# Patient Record
Sex: Female | Born: 1935 | Race: White | Hispanic: No | Smoking: Former smoker
Health system: Southern US, Community
[De-identification: ages and names within clinical notes are randomized; demographics above are authoritative.]

## PROBLEM LIST (undated history)

## (undated) DIAGNOSIS — G8929 Other chronic pain: Secondary | ICD-10-CM

## (undated) DIAGNOSIS — E876 Hypokalemia: Secondary | ICD-10-CM

## (undated) DIAGNOSIS — R413 Other amnesia: Secondary | ICD-10-CM

## (undated) DIAGNOSIS — F32A Depression, unspecified: Secondary | ICD-10-CM

## (undated) DIAGNOSIS — F308 Other manic episodes: Secondary | ICD-10-CM

## (undated) DIAGNOSIS — F419 Anxiety disorder, unspecified: Secondary | ICD-10-CM

## (undated) DIAGNOSIS — I10 Essential (primary) hypertension: Secondary | ICD-10-CM

## (undated) DIAGNOSIS — I952 Hypotension due to drugs: Secondary | ICD-10-CM

## (undated) DIAGNOSIS — M810 Age-related osteoporosis without current pathological fracture: Secondary | ICD-10-CM

## (undated) DIAGNOSIS — D649 Anemia, unspecified: Secondary | ICD-10-CM

## (undated) DIAGNOSIS — E46 Unspecified protein-calorie malnutrition: Secondary | ICD-10-CM

## (undated) DIAGNOSIS — R682 Dry mouth, unspecified: Secondary | ICD-10-CM

## (undated) DIAGNOSIS — I509 Heart failure, unspecified: Secondary | ICD-10-CM

## (undated) DIAGNOSIS — F329 Major depressive disorder, single episode, unspecified: Secondary | ICD-10-CM

## (undated) DIAGNOSIS — N3090 Cystitis, unspecified without hematuria: Secondary | ICD-10-CM

## (undated) DIAGNOSIS — N183 Chronic kidney disease, stage 3 unspecified: Secondary | ICD-10-CM

## (undated) DIAGNOSIS — I5023 Acute on chronic systolic (congestive) heart failure: Secondary | ICD-10-CM

## (undated) DIAGNOSIS — J309 Allergic rhinitis, unspecified: Secondary | ICD-10-CM

## (undated) DIAGNOSIS — R269 Unspecified abnormalities of gait and mobility: Secondary | ICD-10-CM

## (undated) DIAGNOSIS — E039 Hypothyroidism, unspecified: Secondary | ICD-10-CM

## (undated) HISTORY — DX: Chronic kidney disease, stage 3 (moderate): N18.3

## (undated) HISTORY — DX: Unspecified protein-calorie malnutrition: E46

## (undated) HISTORY — DX: Anemia, unspecified: D64.9

## (undated) HISTORY — DX: Heart failure, unspecified: I50.9

## (undated) HISTORY — DX: Major depressive disorder, single episode, unspecified: F32.9

## (undated) HISTORY — DX: Hypotension due to drugs: I95.2

## (undated) HISTORY — DX: Depression, unspecified: F32.A

## (undated) HISTORY — DX: Hypothyroidism, unspecified: E03.9

## (undated) HISTORY — DX: Anxiety disorder, unspecified: F41.9

## (undated) HISTORY — DX: Allergic rhinitis, unspecified: J30.9

## (undated) HISTORY — DX: Other amnesia: R41.3

## (undated) HISTORY — DX: Essential (primary) hypertension: I10

## (undated) HISTORY — DX: Dry mouth, unspecified: R68.2

## (undated) HISTORY — DX: Other manic episodes: F30.8

## (undated) HISTORY — DX: Other chronic pain: G89.29

## (undated) HISTORY — DX: Cystitis, unspecified without hematuria: N30.90

## (undated) HISTORY — PX: CATARACT EXTRACTION: SUR2

## (undated) HISTORY — DX: Acute on chronic systolic (congestive) heart failure: I50.23

## (undated) HISTORY — DX: Age-related osteoporosis without current pathological fracture: M81.0

## (undated) HISTORY — DX: Chronic kidney disease, stage 3 unspecified: N18.30

## (undated) HISTORY — PX: ABDOMINAL HYSTERECTOMY: SHX81

## (undated) HISTORY — DX: Unspecified abnormalities of gait and mobility: R26.9

---

## 2012-11-17 ENCOUNTER — Non-Acute Institutional Stay (SKILLED_NURSING_FACILITY): Payer: Medicare Other | Admitting: Adult Health

## 2012-11-17 DIAGNOSIS — K5900 Constipation, unspecified: Secondary | ICD-10-CM

## 2012-11-17 DIAGNOSIS — D649 Anemia, unspecified: Secondary | ICD-10-CM

## 2012-11-17 DIAGNOSIS — E039 Hypothyroidism, unspecified: Secondary | ICD-10-CM

## 2012-11-17 DIAGNOSIS — F411 Generalized anxiety disorder: Secondary | ICD-10-CM

## 2012-11-17 DIAGNOSIS — J309 Allergic rhinitis, unspecified: Secondary | ICD-10-CM

## 2012-11-17 DIAGNOSIS — F419 Anxiety disorder, unspecified: Secondary | ICD-10-CM

## 2012-11-17 DIAGNOSIS — I509 Heart failure, unspecified: Secondary | ICD-10-CM

## 2012-11-17 DIAGNOSIS — F329 Major depressive disorder, single episode, unspecified: Secondary | ICD-10-CM

## 2012-11-19 ENCOUNTER — Non-Acute Institutional Stay (SKILLED_NURSING_FACILITY): Payer: Medicare Other | Admitting: Internal Medicine

## 2012-11-19 DIAGNOSIS — E039 Hypothyroidism, unspecified: Secondary | ICD-10-CM

## 2012-11-19 DIAGNOSIS — I509 Heart failure, unspecified: Secondary | ICD-10-CM

## 2012-11-19 DIAGNOSIS — J309 Allergic rhinitis, unspecified: Secondary | ICD-10-CM

## 2012-11-19 DIAGNOSIS — F411 Generalized anxiety disorder: Secondary | ICD-10-CM

## 2012-11-20 ENCOUNTER — Other Ambulatory Visit: Payer: Self-pay | Admitting: *Deleted

## 2012-11-20 MED ORDER — ALPRAZOLAM 1 MG PO TABS: ORAL_TABLET | ORAL | Status: DC

## 2012-11-23 ENCOUNTER — Other Ambulatory Visit: Payer: Self-pay | Admitting: *Deleted

## 2012-11-23 MED ORDER — ALPRAZOLAM 1 MG PO TABS: ORAL_TABLET | ORAL | Status: DC

## 2012-11-24 ENCOUNTER — Non-Acute Institutional Stay (SKILLED_NURSING_FACILITY): Payer: Medicare Other | Admitting: Adult Health

## 2012-11-24 DIAGNOSIS — F419 Anxiety disorder, unspecified: Secondary | ICD-10-CM

## 2012-11-24 DIAGNOSIS — F411 Generalized anxiety disorder: Secondary | ICD-10-CM

## 2012-12-15 DIAGNOSIS — E039 Hypothyroidism, unspecified: Secondary | ICD-10-CM | POA: Insufficient documentation

## 2012-12-15 DIAGNOSIS — F411 Generalized anxiety disorder: Secondary | ICD-10-CM | POA: Insufficient documentation

## 2012-12-15 DIAGNOSIS — I5022 Chronic systolic (congestive) heart failure: Secondary | ICD-10-CM | POA: Insufficient documentation

## 2012-12-15 DIAGNOSIS — J309 Allergic rhinitis, unspecified: Secondary | ICD-10-CM | POA: Insufficient documentation

## 2012-12-15 NOTE — Progress Notes (Signed)
Patient ID: Isabel Chaney, female   DOB: 07-31-1919, 79 y.o.   MRN: [id number]        HISTORY & PHYSICAL  DATE: 11/19/2012   FACILITY: [facility]  LEVEL OF CARE: SNF (31)  ALLERGIES:   Penicillin.    CHIEF COMPLAINT:  Manage hypothyroidism, CHF, and allergic rhinitis.    HISTORY OF PRESENT ILLNESS:  The patient is a 79 year-old, Caucasian female who was  transferred from [facility].  She has the following problems:    HYPOTHYROIDISM: The hypothyroidism remains stable. No complications noted from the medications presently being used.  The patient denies fatigue or constipation.  Last TSH:  Not available.    CHF:The patient does not relate significant weight changes, denies sob, DOE, orthopnea, PNDs, pedal edema, palpitations or chest pain.  CHF remains stable.  No complications form the medications being used.    ALLERGIC RHINITIS: Allergic rhinitis remains stable. Patient denies ongoing symptoms such as runny nose, sneezing or tearing. No complications reported from the current medication(s) being used.    PAST MEDICAL HISTORY :   Hypothyroidism.    CHF.    Allergic rhinitis.    Chronic anemia.    Anxiety.    Depression.    Constipation.    PAST SURGICAL HISTORY: None  SOCIAL HISTORY: TOBACCO USE:  The patient has a history of smoking, but quit in 1966.     ALCOHOL:  Denies alcohol use.   ILLICIT DRUGS:  Denies illicit drug use.    EMPLOYMENT HISTORY:  She is a retired Copywriter, advertising.    FAMILY HISTORY:  Significant for:    Coronary artery disease.   CVA.    CURRENT MEDICATIONS: Reviewed per [facility]  REVIEW OF SYSTEMS:  See HPI otherwise 14 point ROS is negative.  PHYSICAL EXAMINATION  VS:  T 98.1       P 51      RR 16      BP 124/70      POX 96%        WT (Lb) 141.3    GENERAL: no acute distress, moderately obese body habitus EYES: conjunctivae normal, sclerae normal, normal eye lids MOUTH/THROAT: lips without  lesions,no lesions in the mouth,tongue is without lesions,uvula elevates in midline NECK: supple, trachea midline, no neck masses, no thyroid tenderness, no thyromegaly LYMPHATICS: no LAN in the neck, no supraclavicular LAN RESPIRATORY: breathing is even & unlabored, BS CTAB CARDIAC: RRR, no murmur,no extra heart sounds, no edema GI:  ABDOMEN: abdomen soft, normal BS, no masses, no tenderness  LIVER/SPLEEN: no hepatomegaly, no splenomegaly MUSCULOSKELETAL: HEAD: normal to inspection & palpation BACK: no kyphosis, scoliosis or spinal processes tenderness EXTREMITIES: LEFT UPPER EXTREMITY: strength decreased, range of motion minimal   RIGHT UPPER EXTREMITY: strength decreased, range of motion minimal   LEFT LOWER EXTREMITY: strength unable to assess, range of motion minimal  RIGHT LOWER EXTREMITY: strength unable to assess, range of motion minimal   PSYCHIATRIC: the patient is alert & oriented to person, affect & behavior appropriate  LABS/RADIOLOGY:  None received at admission.      ASSESSMENT/PLAN:  Hypothyroidism.  Continue levothyroxine.    CHF.  Well compensated.    Allergic rhinitis.  Denies ongoing symptoms.     Anxiety.  Stable.  We will decrease Xanax to 1 mg b.i.d. on daughter's request.    Chronic kidney disease stage III.  We will assess renal functions.    Anemia of chronic disease.  Check  hemoglobin level.    Check CBC and BMP.    I have reviewed patient's medical records received at admission/from hospitalization.  CPT CODE: 16109

## 2012-12-28 ENCOUNTER — Other Ambulatory Visit: Payer: Self-pay | Admitting: *Deleted

## 2012-12-28 MED ORDER — FENTANYL 12 MCG/HR TD PT72: 1.0000 | MEDICATED_PATCH | TRANSDERMAL | Status: DC

## 2012-12-28 NOTE — Telephone Encounter (Signed)
rx printed, signed, faxed 

## 2013-01-01 ENCOUNTER — Non-Acute Institutional Stay (SKILLED_NURSING_FACILITY): Payer: Medicare Other | Admitting: Adult Health

## 2013-01-01 DIAGNOSIS — N390 Urinary tract infection, site not specified: Secondary | ICD-10-CM

## 2013-01-04 ENCOUNTER — Non-Acute Institutional Stay (SKILLED_NURSING_FACILITY): Payer: Medicare Other | Admitting: Adult Health

## 2013-01-04 DIAGNOSIS — F419 Anxiety disorder, unspecified: Secondary | ICD-10-CM

## 2013-01-04 DIAGNOSIS — D649 Anemia, unspecified: Secondary | ICD-10-CM | POA: Insufficient documentation

## 2013-01-04 DIAGNOSIS — F329 Major depressive disorder, single episode, unspecified: Secondary | ICD-10-CM | POA: Insufficient documentation

## 2013-01-04 DIAGNOSIS — H6120 Impacted cerumen, unspecified ear: Secondary | ICD-10-CM | POA: Insufficient documentation

## 2013-01-04 DIAGNOSIS — N390 Urinary tract infection, site not specified: Secondary | ICD-10-CM | POA: Insufficient documentation

## 2013-01-04 DIAGNOSIS — F32A Depression, unspecified: Secondary | ICD-10-CM | POA: Insufficient documentation

## 2013-01-04 DIAGNOSIS — K5900 Constipation, unspecified: Secondary | ICD-10-CM | POA: Insufficient documentation

## 2013-01-04 DIAGNOSIS — E039 Hypothyroidism, unspecified: Secondary | ICD-10-CM | POA: Insufficient documentation

## 2013-01-04 DIAGNOSIS — I509 Heart failure, unspecified: Secondary | ICD-10-CM

## 2013-01-04 DIAGNOSIS — H6123 Impacted cerumen, bilateral: Secondary | ICD-10-CM

## 2013-01-04 DIAGNOSIS — J309 Allergic rhinitis, unspecified: Secondary | ICD-10-CM

## 2013-01-04 DIAGNOSIS — F411 Generalized anxiety disorder: Secondary | ICD-10-CM

## 2013-01-04 NOTE — Progress Notes (Signed)
Patient ID: Isabel Chaney, female   DOB: [birth year], 79 y.o.   MRN: [id number]       PROGRESS NOTE  DATE: 11/17/2012  FACILITY:  [facility]  LEVEL OF CARE:   SNF (31)  Acute Visit  CHIEF COMPLAINT:  Admission Notes  HISTORY OF PRESENT ILLNESS: This is a 79 year old female who has been admitted to [facility] on 11/16/12 from [facility]. She has been admitted for  long-term care.  REASSESSMENT OF ONGOING PROBLEM(S):  CHF:The patient does not relate significant weight changes, denies sob, DOE, orthopnea, PNDs, pedal edema, palpitations or chest pain.  CHF remains stable.  No complications form the medications being used.  DEPRESSION: The depression remains stable. Patient denies ongoing feelings of sadness, insomnia, anedhonia or lack of appetite. No complications reported from the medications currently being used. Staff do not report behavioral problems.  CONSTIPATION: The constipation remains stable. No complications from the medications presently being used. Patient denies ongoing constipation, abdominal pain, nausea or vomiting.  PAST MEDICAL HISTORY : Reviewed.  No changes.  CURRENT MEDICATIONS: Reviewed per [facility]  REVIEW OF SYSTEMS:  GENERAL: no change in appetite, no fatigue, no weight changes, no fever, chills or weakness RESPIRATORY: no cough, SOB, DOE, wheezing, hemoptysis CARDIAC: no chest pain, edema or palpitations GI: no abdominal pain, diarrhea, constipation, heart burn, nausea or vomiting MUSCULOSKELETAL:  + low back pain   PHYSICAL EXAMINATION  VS:  T96       P78      RR18      BP120/74          GENERAL: no acute distress, normal body habitus EYES: conjunctivae normal, sclerae normal, normal eye lids NECK: supple, trachea midline, no neck masses, no thyroid tenderness, no thyromegaly LYMPHATICS: no LAN in the neck, no supraclavicular LAN RESPIRATORY: breathing is even & unlabored, BS CTAB CARDIAC: RRR, no murmur,no extra heart sounds, no  edema GI: abdomen soft, normal BS, no masses, no tenderness, no hepatomegaly, no splenomegaly PSYCHIATRIC: the patient is alert & oriented to person, affect & behavior appropriate  LABS/RADIOLOGY: None in chart    ASSESSMENT/PLAN:  CHF - well-compensated  Hypothyroidism - continue Synthroid  Anemia - continue Ferrous Sulfate  Depression - stable  Allergic Rhinitis - stable  Constipation - no complaints  Anxiety - stable  Generalized Pain - start Percocet 5/325 mg 1 tab PO Q 4 hours PRN    CPT CODE: 56213

## 2013-01-04 NOTE — Progress Notes (Addendum)
Patient ID: Isabel Chaney, female   DOB: [birth year], 79 y.o.   MRN: [id number]       PROGRESS NOTE  DATE: 01/01/2013  FACILITY:    [facility]  LEVEL OF CARE:   SNF (31)  Acute Visit  CHIEF COMPLAINT:  Manage UTI  HISTORY OF PRESENT ILLNESS: This is a 79 year old female who has urine culture >= 100,000 colonies/ml Enterococcus faecalis. Urine culture was obtained due to dysuria. No reported hematuria nor fever.   PAST MEDICAL HISTORY : Reviewed.  No changes.  CURRENT MEDICATIONS: Reviewed per [facility]  REVIEW OF SYSTEMS:  GENERAL: no change in appetite, no fatigue, no weight changes, no fever, chills or weakness RESPIRATORY: no cough, SOB, DOE,, wheezing, hemoptysis CARDIAC: no chest pain, edema or palpitations GI: no abdominal pain, diarrhea, constipation, heart burn, nausea or vomiting GU:  +Dysuria   PHYSICAL EXAMINATION  VS:  T97        P70       RR20       BP135/58      POX %       WT 143.2(Lb)  GENERAL: no acute distress, normal body habitus EYES: conjunctivae normal, sclerae normal, normal eye lids NECK: supple, trachea midline, no neck masses, no thyroid tenderness, no thyromegaly RESPIRATORY: breathing is even & unlabored, BS CTAB CARDIAC: RRR, no murmur,no extra heart sounds, no edema GI: abdomen soft, normal BS, no masses, no tenderness, no hepatomegaly, no splenomegaly PSYCHIATRIC: the patient is alert & oriented to person, affect & behavior appropriate  LABS/RADIOLOGY:  ASSESSMENT/PLAN:  UTI - start Macrobid 100 mg 1 capsule PO BID x 10 days  CPT CODE: 40981

## 2013-01-04 NOTE — Progress Notes (Signed)
Patient ID: Isabel Chaney, female   DOB: 06-11-1919, 79 y.o.   MRN: [id number]       PROGRESS NOTE  DATE:  01/04/13  FACILITY: Nursing Home Location: [facility]  LEVEL OF CARE: SNF (31)   Routine Visit  CHIEF COMPLAINT:  Manage CHF, Hypothyroidism, Anemia and Depression  HISTORY OF PRESENT ILLNESS:   REASSESSMENT OF ONGOING PROBLEM(S):  HYPOTHYROIDISM: The hypothyroidism remains stable. No complications noted from the medications presently being used.  The patient denies fatigue or constipation.    CHF:The patient does not relate significant weight changes, denies sob, DOE, orthopnea, PNDs, pedal edema, palpitations or chest pain.  CHF remains stable.  No complications form the medications being used.  ANXIETY: The anxiety remains stable. Patient denies ongoing anxiety or irritability. No complications reported from the medications currently being used.  PAST MEDICAL HISTORY : Reviewed.  No changes.  CURRENT MEDICATIONS: Reviewed per [facility]  REVIEW OF SYSTEMS:  GENERAL: no change in appetite, no fatigue, no weight changes, no fever, chills or weakness EARS: decreased hearing on both ears RESPIRATORY: no cough, SOB, DOE, wheezing, hemoptysis CARDIAC: no chest pain, edema or palpitations GI: no abdominal pain, diarrhea, constipation, heart burn, nausea or vomiting MUSCULOSKELETAL:  + low back pain   PHYSICAL EXAMINATION  VS:  T97      P60      RR18      BP112/56      WT 143.2 LBS    GENERAL: no acute distress, normal body habitus EARS: noted to have moderate earwax on bilateral ears EYES: conjunctivae normal, sclerae normal, normal eye lids NECK: supple, trachea midline, no neck masses, no thyroid tenderness, no thyromegaly RESPIRATORY: breathing is even & unlabored, BS CTAB CARDIAC: RRR, no murmur,no extra heart sounds, no edema GI: abdomen soft, normal BS, no masses, no tenderness, no hepatomegaly, no splenomegaly PSYCHIATRIC: the patient is alert &  oriented to person, affect & behavior appropriate  LABS/RADIOLOGY: None in chart    ASSESSMENT/PLAN:  CHF - well-compensated  Hypothyroidism - continue Synthroid; check tsh  Anemia - continue Ferrous Sulfate; check CBC  Depression - stable; continue Zoloft  Allergic Rhinitis - stable; continue Allegra  Constipation - no complaints  Anxiety - stable; continue Xanax      CPT CODE: 53664

## 2013-01-04 NOTE — Progress Notes (Signed)
Patient ID: Isabel Chaney, female   DOB: 17-May-1919, 79 y.o.   MRN: [id number]       PROGRESS NOTE  DATE: 11/24/2012  FACILITY:  [facility]  LEVEL OF CARE: SNF (31)  Acute Visit  CHIEF COMPLAINT:  Manage Anxiety  HISTORY OF PRESENT ILLNESS: This is a 79 year old female who was noted to be anxious. She was noted to be fidgeting with her knitting materials - repeatedly taking in and out from her drawer. Xanax dosage was recently decreased. Daughter requested Xanax dosage to be increased to her previous dosage.  PAST MEDICAL HISTORY : Reviewed.  No changes.  CURRENT MEDICATIONS: Reviewed per [facility]  REVIEW OF SYSTEMS:  GENERAL: no change in appetite, no fatigue, no weight changes, no fever, chills or weakness RESPIRATORY: no cough, SOB, DOE,, wheezing, hemoptysis CARDIAC: no chest pain, edema or palpitations GI: no abdominal pain, diarrhea, constipation, heart burn, nausea or vomiting  PHYSICAL EXAMINATION  VS:  T97        P84       RR20       BP110/62      POX95 %       WT147.4 (Lb)  GENERAL: no acute distress, normal body habitus EYES: conjunctivae normal, sclerae normal, normal eye lids NECK: supple, trachea midline, no neck masses, no thyroid tenderness, no thyromegaly LYMPHATICS: no LAN in the neck, no supraclavicular LAN RESPIRATORY: breathing is even & unlabored, BS CTAB CARDIAC: RRR, no murmur,no extra heart sounds, no edema GI: abdomen soft, normal BS, no masses, no tenderness, no hepatomegaly, no splenomegaly PSYCHIATRIC: the patient is alert & oriented to person, anxious  LABS/RADIOLOGY: None in chart   ASSESSMENT/PLAN:  Anxiety -  Increase Xanax to 1 mg 1 tab PO TID  CPT CODE: 40981

## 2013-01-06 ENCOUNTER — Other Ambulatory Visit: Payer: Self-pay | Admitting: *Deleted

## 2013-01-06 MED ORDER — FENTANYL 12 MCG/HR TD PT72: MEDICATED_PATCH | TRANSDERMAL | Status: DC

## 2013-02-04 ENCOUNTER — Non-Acute Institutional Stay (SKILLED_NURSING_FACILITY): Payer: Medicare Other | Admitting: Adult Health

## 2013-02-04 DIAGNOSIS — K5900 Constipation, unspecified: Secondary | ICD-10-CM

## 2013-02-04 DIAGNOSIS — R682 Dry mouth, unspecified: Secondary | ICD-10-CM

## 2013-02-04 DIAGNOSIS — E039 Hypothyroidism, unspecified: Secondary | ICD-10-CM

## 2013-02-04 DIAGNOSIS — K117 Disturbances of salivary secretion: Secondary | ICD-10-CM

## 2013-02-04 DIAGNOSIS — J309 Allergic rhinitis, unspecified: Secondary | ICD-10-CM

## 2013-02-04 DIAGNOSIS — F329 Major depressive disorder, single episode, unspecified: Secondary | ICD-10-CM

## 2013-02-04 DIAGNOSIS — I509 Heart failure, unspecified: Secondary | ICD-10-CM

## 2013-02-08 ENCOUNTER — Other Ambulatory Visit: Payer: Self-pay

## 2013-02-08 MED ORDER — FENTANYL 12 MCG/HR TD PT72: MEDICATED_PATCH | TRANSDERMAL | Status: DC

## 2013-02-11 DIAGNOSIS — R682 Dry mouth, unspecified: Secondary | ICD-10-CM | POA: Insufficient documentation

## 2013-02-11 NOTE — Progress Notes (Signed)
Patient ID: Isabel Chaney, female   DOB: November 14, 1919, 79 y.o.   MRN: [id number]       PROGRESS NOTE  DATE:  02/04/13  FACILITY: Nursing Home Location: [facility]  LEVEL OF CARE: SNF (31)   Routine Visit  CHIEF COMPLAINT:  Manage CHF, Hypothyroidism and Depression  HISTORY OF PRESENT ILLNESS:   REASSESSMENT OF ONGOING PROBLEM(S):  DEPRESSION: The depression remains stable. Patient denies ongoing feelings of sadness, insomnia, anedhonia or lack of appetite. No complications reported from the medications currently being used. Staff do not report behavioral problems.  CONSTIPATION: The constipation remains stable. No complications from the medications presently being used. Patient denies ongoing constipation, abdominal pain, nausea or vomiting.  ALLERGIC RHINITIS: Allergic rhinitis remains stable.  Patient denies ongoing symptoms such as runny nose sneezing or tearing. No complications reported from the current medication(s) being used.  PAST MEDICAL HISTORY : Reviewed.  No changes.  CURRENT MEDICATIONS: Reviewed per [facility]  REVIEW OF SYSTEMS:  GENERAL: no change in appetite, no fatigue, no weight changes, no fever, chills or weakness MOUTH: + dry mouth EARS: decreased hearing on both ears RESPIRATORY: no cough, SOB, DOE, wheezing, hemoptysis CARDIAC: no chest pain, edema or palpitations GI: no abdominal pain, diarrhea, constipation, heart burn, nausea or vomiting MUSCULOSKELETAL:  + low back pain   PHYSICAL EXAMINATION  VS:  T97.4     P72      RR18     BP132/68      WT 152.8 LBS    GENERAL: no acute distress, normal body habitus MOUTH :  No lesions EARS: noted to have moderate earwax on bilateral ears NECK: supple, trachea midline, no neck masses, no thyroid tenderness, no thyromegaly RESPIRATORY: breathing is even & unlabored, BS CTAB CARDIAC: RRR, no murmur,no extra heart sounds, no edema GI: abdomen soft, normal BS, no masses, no tenderness, no hepatomegaly,  no splenomegaly PSYCHIATRIC: the patient is alert & oriented to person, affect & behavior appropriate  LABS/RADIOLOGY: 01/28/13 BNP 158 TSH 2.5230 01/05/13 WBC 4.2 hemoglobin 12.0 hematocrit 37.9 sodium 137 potassium 4.4 glucose 129 BUN 42 creatinine 1.0 calcium 10.1 TSH 2.0 910  ASSESSMENT/PLAN:  CHF - well-compensated; continue Demadex and Coreg  Hypothyroidism - continue Synthroid; check tsh  Depression - stable; continue Zoloft  Allergic Rhinitis - stable; continue Allegra  Constipation - no complaints  Anxiety - stable; continue Xanax  Dry mouth - start Biotin mouthwash 10 ml swish and spit Q HS    CPT CODE: 01027

## 2013-03-01 ENCOUNTER — Other Ambulatory Visit: Payer: Self-pay | Admitting: *Deleted

## 2013-03-01 MED ORDER — FENTANYL 12 MCG/HR TD PT72: MEDICATED_PATCH | TRANSDERMAL | Status: DC

## 2013-03-10 ENCOUNTER — Non-Acute Institutional Stay (SKILLED_NURSING_FACILITY): Payer: Medicare Other | Admitting: Adult Health

## 2013-03-10 DIAGNOSIS — E039 Hypothyroidism, unspecified: Secondary | ICD-10-CM

## 2013-03-10 DIAGNOSIS — J309 Allergic rhinitis, unspecified: Secondary | ICD-10-CM

## 2013-03-10 DIAGNOSIS — I509 Heart failure, unspecified: Secondary | ICD-10-CM

## 2013-03-10 DIAGNOSIS — K5900 Constipation, unspecified: Secondary | ICD-10-CM

## 2013-03-10 DIAGNOSIS — F411 Generalized anxiety disorder: Secondary | ICD-10-CM

## 2013-03-10 DIAGNOSIS — F329 Major depressive disorder, single episode, unspecified: Secondary | ICD-10-CM

## 2013-03-10 NOTE — Progress Notes (Signed)
Patient ID: Isabel Chaney, female   DOB: [birth year], 79 y.o.   MRN: [id number]                PROGRESS NOTE  DATE:  03/10/13  FACILITY: Nursing Home Location: [facility]  LEVEL OF CARE: SNF (31)   Routine Visit  CHIEF COMPLAINT:  Manage CHF, Hypothyroidism and Depression  HISTORY OF PRESENT ILLNESS:   REASSESSMENT OF ONGOING PROBLEM(S):  CHF:The patient does not relate significant weight changes, denies sob, DOE, orthopnea, PNDs, pedal edema, palpitations or chest pain.  CHF remains stable.  No complications form the medications being used.  HYPOTHYROIDISM: The hypothyroidism remains stable. No complications noted from the medications presently being used.  The patient denies fatigue or constipation.  11/14t TSH 2.5230  ANXIETY: The anxiety remains stable. Patient denies ongoing anxiety or irritability. No complications reported from the medications currently being used.   PAST MEDICAL HISTORY : Reviewed.  No changes.  CURRENT MEDICATIONS: Reviewed per [facility]  REVIEW OF SYSTEMS:  GENERAL: no change in appetite, no fatigue, no weight changes, no fever, chills or weakness EARS: decreased hearing on both ears RESPIRATORY: no cough, SOB, DOE, wheezing, hemoptysis CARDIAC: no chest pain, edema or palpitations GI: no abdominal pain, diarrhea, constipation, heart burn, nausea or vomiting  PHYSICAL EXAMINATION  VS:  T98.8    P75    RR16    BP122/68      WT156 LBS    GENERAL: no acute distress, normal body habitus NECK: supple, trachea midline, no neck masses, no thyroid tenderness, no thyromegaly RESPIRATORY: breathing is even & unlabored, BS CTAB CARDIAC: RRR, no murmur,no extra heart sounds, no edema GI: abdomen soft, normal BS, no masses, no tenderness, no hepatomegaly, no splenomegaly PSYCHIATRIC: the patient is alert & oriented to person, affect & behavior appropriate  LABS/RADIOLOGY: /16/14 WBC 4.4 hemoglobin 10.9 hematocrit 35.0 01/28/13 BNP 158 TSH  2.5230 01/05/13 WBC 4.2 hemoglobin 12.0 hematocrit 37.9 sodium 137 potassium 4.4 glucose 129 BUN 42 creatinine 1.0 calcium 10.1 TSH 2.0 910  ASSESSMENT/PLAN:  CHF - well-compensated; continue Demadex and Coreg  Hypothyroidism - continue Synthroid  Depression - stable; continue Zoloft  Allergic Rhinitis - stable; continue Allegra  Constipation - no complaints  Anxiety - stable; continue Xanax    CPT CODE: 40981

## 2013-03-19 ENCOUNTER — Other Ambulatory Visit: Payer: Self-pay | Admitting: *Deleted

## 2013-03-19 MED ORDER — FENTANYL 12 MCG/HR TD PT72: MEDICATED_PATCH | TRANSDERMAL | Status: DC

## 2013-04-17 ENCOUNTER — Non-Acute Institutional Stay (SKILLED_NURSING_FACILITY): Payer: Medicare Other | Admitting: Adult Health

## 2013-04-17 DIAGNOSIS — F32A Depression, unspecified: Secondary | ICD-10-CM

## 2013-04-17 DIAGNOSIS — F411 Generalized anxiety disorder: Secondary | ICD-10-CM

## 2013-04-17 DIAGNOSIS — E039 Hypothyroidism, unspecified: Secondary | ICD-10-CM

## 2013-04-17 DIAGNOSIS — F329 Major depressive disorder, single episode, unspecified: Secondary | ICD-10-CM

## 2013-04-17 DIAGNOSIS — D649 Anemia, unspecified: Secondary | ICD-10-CM

## 2013-04-17 DIAGNOSIS — F3289 Other specified depressive episodes: Secondary | ICD-10-CM

## 2013-04-17 DIAGNOSIS — J309 Allergic rhinitis, unspecified: Secondary | ICD-10-CM

## 2013-04-17 DIAGNOSIS — F419 Anxiety disorder, unspecified: Secondary | ICD-10-CM

## 2013-04-17 DIAGNOSIS — I509 Heart failure, unspecified: Secondary | ICD-10-CM

## 2013-04-17 DIAGNOSIS — K5900 Constipation, unspecified: Secondary | ICD-10-CM

## 2013-04-17 NOTE — Progress Notes (Signed)
Patient ID: Isabel Chaney Isabel Chaney, female   DOB: 11/20/1919, 80 y.o.   MRN: [id number]                 PROGRESS NOTE  DATE:  04/17/13  FACILITY: Nursing Home Location: [facility]  LEVEL OF CARE: SNF (31)   Routine Visit  CHIEF COMPLAINT:  Manage CHF, Hypothyroidism and Depression  HISTORY OF PRESENT ILLNESS:   REASSESSMENT OF ONGOING PROBLEM(S):  CHF:The patient does not relate significant weight changes, denies sob, DOE, orthopnea, PNDs, pedal edema, palpitations or chest pain.  CHF remains stable.  Noted bradycardic at times Pulse 54, 52, 50  ALLERGIC RHINITIS: Allergic rhinitis remains stable.  Patient denies ongoing symptoms such as runny nose sneezing or tearing. No complications reported from the current medication(s) being used.  DEPRESSION: The depression remains stable. Patient denies ongoing feelings of sadness, insomnia, anedhonia or lack of appetite. No complications reported from the medications currently being used. Staff do not report behavioral problems.  PAST MEDICAL HISTORY : Reviewed.  No changes.  [facility]  REVIEW OF SYSTEMS:  GENERAL: no change in appetite, no fatigue, no weight changes, no fever, chills or weakness EARS: decreased hearing on both ears RESPIRATORY: no cough, SOB, DOE, wheezing, hemoptysis CARDIAC: no chest pain, edema or palpitations GI: no abdominal pain, diarrhea, constipation, heart burn, nausea or vomiting  PHYSICAL EXAMINATION  VS:  T96.5    P54   RR19    BP132/63      WT152.6 LBS    GENERAL: no acute distress, normal body habitus NECK: supple, trachea midline, no neck masses, no thyroid tenderness, no thyromegaly LYMPHATICS: No adenopathy in the cervical, supra-clavicular, axillary, or inguinal areas RESPIRATORY: breathing is even & unlabored, BS CTAB CARDIAC: RRR, no murmur,no extra heart sounds, no edema GI: abdomen soft, normal BS, no masses, no tenderness, no hepatomegaly, no  splenomegaly PSYCHIATRIC: the patient is alert & oriented to person, affect & behavior appropriate  LABS/RADIOLOGY: 03/10/13 WBC 4.7 hemoglobin 9.8 hematocrit 31.5 03/02/13 WBC 4.4 hemoglobin 10.9 hematocrit 35.0 01/28/13 BNP 158 TSH 2.5230 01/05/13 WBC 4.2 hemoglobin 12.0 hematocrit 37.9 sodium 137 potassium 4.4 glucose 129 BUN 42 creatinine 1.0 calcium 10.1 TSH 2.0 910  ASSESSMENT/PLAN:  CHF - well-compensated; continue Demadex and  Decrease Coreg to 3.125 mg by mouth twice a day; BP/heart rate every shift x1 week  Hypothyroidism - continue Synthroid  Depression - stable; continue Zoloft  Allergic Rhinitis - stable; continue Allegra  Constipation - no complaints  Anxiety - stable; continue Xanax  Anemia - start ferrous sulfate 325 mg 1 tab by mouth daily; CBC in 2 weeks   CPT CODE: [id number]

## 2013-05-19 ENCOUNTER — Encounter: Payer: Self-pay | Admitting: Adult Health

## 2013-06-10 ENCOUNTER — Encounter: Payer: Self-pay | Admitting: *Deleted

## 2013-06-10 ENCOUNTER — Encounter (HOSPITAL_BASED_OUTPATIENT_CLINIC_OR_DEPARTMENT_OTHER): Payer: Medicare Other | Attending: Internal Medicine

## 2013-06-10 DIAGNOSIS — I739 Peripheral vascular disease, unspecified: Secondary | ICD-10-CM | POA: Diagnosis not present

## 2013-06-10 DIAGNOSIS — E039 Hypothyroidism, unspecified: Secondary | ICD-10-CM | POA: Insufficient documentation

## 2013-06-10 DIAGNOSIS — L8992 Pressure ulcer of unspecified site, stage 2: Secondary | ICD-10-CM | POA: Diagnosis not present

## 2013-06-10 DIAGNOSIS — L89609 Pressure ulcer of unspecified heel, unspecified stage: Secondary | ICD-10-CM | POA: Diagnosis not present

## 2013-06-10 DIAGNOSIS — M810 Age-related osteoporosis without current pathological fracture: Secondary | ICD-10-CM | POA: Insufficient documentation

## 2013-06-10 DIAGNOSIS — N189 Chronic kidney disease, unspecified: Secondary | ICD-10-CM | POA: Insufficient documentation

## 2013-06-11 ENCOUNTER — Encounter: Payer: Self-pay | Admitting: Internal Medicine

## 2013-06-11 ENCOUNTER — Non-Acute Institutional Stay (SKILLED_NURSING_FACILITY): Payer: Commercial Managed Care - HMO | Admitting: Internal Medicine

## 2013-06-11 DIAGNOSIS — I509 Heart failure, unspecified: Secondary | ICD-10-CM

## 2013-06-11 DIAGNOSIS — N183 Chronic kidney disease, stage 3 unspecified: Secondary | ICD-10-CM | POA: Insufficient documentation

## 2013-06-11 DIAGNOSIS — L89609 Pressure ulcer of unspecified heel, unspecified stage: Secondary | ICD-10-CM | POA: Insufficient documentation

## 2013-06-11 DIAGNOSIS — F411 Generalized anxiety disorder: Secondary | ICD-10-CM

## 2013-06-11 DIAGNOSIS — N390 Urinary tract infection, site not specified: Secondary | ICD-10-CM

## 2013-06-11 DIAGNOSIS — D649 Anemia, unspecified: Secondary | ICD-10-CM

## 2013-06-11 DIAGNOSIS — E039 Hypothyroidism, unspecified: Secondary | ICD-10-CM

## 2013-06-11 DIAGNOSIS — I10 Essential (primary) hypertension: Secondary | ICD-10-CM

## 2013-06-11 DIAGNOSIS — I5022 Chronic systolic (congestive) heart failure: Secondary | ICD-10-CM

## 2013-06-11 DIAGNOSIS — L8990 Pressure ulcer of unspecified site, unspecified stage: Secondary | ICD-10-CM

## 2013-06-11 NOTE — Progress Notes (Signed)
MRN: [id number] Name: SHINE SCROGHAM  Sex: female Age: 80 y.o. DOB: [birth year]  PSC #: [address] Facility/Room: 803-1 Level Of Care: SNF Provider: Merrilee Seashore D Emergency Contacts: Extended Emergency Contact Information Primary Emergency Contact: Utz,Carol Address: [address]          [place], [place] [address] Darden Amber of [place] Home Phone: [phone] Work Phone: [phone] Relation: Daughter Secondary Emergency Contact: Utz,Larry Address: [address]          [place], [place] [address] Darden Amber of [place] Home Phone: [phone] Work Phone: [phone] Relation: Relative  Code Status: DNR  Allergies: Penicillins  Chief Complaint  Patient presents with  . nursing home admission    HPI: Patient is 80 y.o. female who had encephalopathy from a UTI who is admitted to SNF.  Past Medical History  Diagnosis Date  . Hypothyroidism   . Anemia   . Anxiety   . Depression   . Acute on chronic systolic heart failure   . Osteoporosis   . CHF (congestive heart failure)   . Chronic kidney disease, stage III (moderate)   . Hypertension     History reviewed. No pertinent past surgical history.    Medication List       This list is accurate as of: 06/11/13  8:02 PM.  Always use your most recent med list.               ALPRAZolam 1 MG tablet  Commonly known as:  XANAX  Take 1 mg by mouth 3 (three) times daily as needed. For anxiety     ARTIFICIAL TEARS OP  Apply to eye. Instill 2 drops each eye twice daily for dry eyes. Wait 3-5 minutes between 2 eye medications     carvedilol 6.25 MG tablet  Commonly known as:  COREG  Take 3.125 mg by mouth daily. For hypertension     ciprofloxacin 500 MG tablet  Commonly known as:  CIPRO  Take 500 mg by mouth 2 (two) times daily.     DECUBI-VITE PO  Take by mouth daily. For wound healing     fentaNYL 12 MCG/HR  Commonly known as:  DURAGESIC - dosed mcg/hr  Apply 1 patch topically every 48 hours  *  REMOVE OLD PATCH** EXTERNAL USE ONLY* ROTATE SITES** : Check placement of patch every shift     ferrous sulfate 325 (65 FE) MG tablet  Take 325 mg by mouth daily with breakfast.     fexofenadine 180 MG tablet  Commonly known as:  ALLEGRA  Take 180 mg by mouth daily. For allergies     levothyroxine 50 MCG tablet  Commonly known as:  SYNTHROID, LEVOTHROID  Take 50 mcg by mouth daily before breakfast. For Hypothyroid, check pulse weekly     oxyCODONE-acetaminophen 5-325 MG per tablet  Commonly known as:  PERCOCET/ROXICET  Take 1 tablet by mouth every 4 (four) hours as needed for severe pain.     polyethylene glycol packet  Commonly known as:  MIRALAX / GLYCOLAX  Take 17 g by mouth daily. 4-8 oz of liquid daily for constipation *HOLD for diarrhea*     sertraline 50 MG tablet  Commonly known as:  ZOLOFT  Take 50 mg by mouth daily. For depression     torsemide 10 MG tablet  Commonly known as:  DEMADEX  Take 10 mg by mouth daily. For hypertension     vitamin B-12 1000 MCG tablet  Commonly known as:  CYANOCOBALAMIN  Take 1,000  mcg by mouth daily.     Vitamin D-3 1000 UNITS Caps  Take by mouth. 4 by mouth daily        Meds ordered this encounter  Medications  . oxyCODONE-acetaminophen (PERCOCET/ROXICET) 5-325 MG per tablet    Sig: Take 1 tablet by mouth every 4 (four) hours as needed for severe pain.    Immunization History  Administered Date(s) Administered  . Influenza-Unspecified 12/09/2012  . PPD Test 12/14/2012    History  Substance Use Topics  . Smoking status: Unknown If Ever Smoked  . Smokeless tobacco: Not on file  . Alcohol Use: Not on file    Family history is noncontributory    Review of Systems  DATA OBTAINED: from patient GENERAL: Feels well no fevers, fatigue, appetite changes SKIN: No itching, rash EYES: No eye pain, redness, discharge EARS: No earache, tinnitus, change in hearing NOSE: No congestion, drainage or bleeding  MOUTH/THROAT: No  mouth or tooth pain, No sore throat RESPIRATORY: No cough, wheezing, SOB CARDIAC: No chest pain, palpitations, lower extremity edema  GI: No abdominal pain, No N/V/D or constipation, No heartburn or reflux  GU: No dysuria, frequency or urgency, or incontinence  MUSCULOSKELETAL: No unrelieved bone/joint pain NEUROLOGIC: No headache, dizziness or focal weakness PSYCHIATRIC: No overt anxiety or sadness. Sleeps well. No behavior issue.   Filed Vitals:   06/11/13 1936  BP: 194/86  Pulse: 75  Temp: 98 F (36.7 C)  Resp: 20    Physical Exam  GENERAL APPEARANCE: Alert, conversant. Appropriately groomed. No acute distress.  SKIN: No diaphoresis rash; dressing L heel, no heat HEAD: Normocephalic, atraumatic  EYES: Conjunctiva/lids clear. Pupils round, reactive. EOMs intact.  EARS: External exam WNL, canals clear. Hearing grossly normal.  NOSE: No deformity or discharge.  MOUTH/THROAT: Lips w/o lesions RESPIRATORY: Breathing is even, unlabored. Lung sounds are clear   CARDIOVASCULAR: Heart RRR no murmurs, rubs or gallops. No peripheral edema.  GASTROINTESTINAL: Abdomen is soft, non-tender, not distended w/ normal bowel sounds GENITOURINARY: Bladder non tender, not distended  MUSCULOSKELETAL: No abnormal joints or musculature NEUROLOGIC: Oriented X3. Cranial nerves 2-12 grossly intact. Moves all extremities no tremor. PSYCHIATRIC: mild dementia, no behavioral issues  Patient Active Problem List   Diagnosis Date Noted  . Pressure ulcer of heel 06/11/2013  . Chronic kidney disease, stage III (moderate)   . Hypertension   . Dry mouth 02/11/2013  . Depression 01/04/2013  . Hypothyroidism 01/04/2013  . Constipation 01/04/2013  . Anemia 01/04/2013  . Anxiety 01/04/2013  . UTI (urinary tract infection) 01/04/2013  . Impacted cerumen 01/04/2013  . Unspecified hypothyroidism 12/15/2012  . Chronic systolic congestive heart failure 12/15/2012  . Allergic rhinitis, cause unspecified  12/15/2012  . Anxiety state, unspecified 12/15/2012      Assessment and Plan  UTI (urinary tract infection) Kliebsiella sensitive to cipro  Unspecified hypothyroidism Continue levothyroxine 50 mcg  Chronic systolic congestive heart failure Continue demedex and coreg  Anxiety state, unspecified Continue zoloft and xanax  Anemia Chronic, on B12 and ferrous sulfate  Chronic kidney disease, stage III (moderate) Cr 1.1 on discharge  Pressure ulcer of heel Chronic of both heels-wound L heel;routine wound care  Hypertension On lasix and demedex but not controlled -will start norvasc 10 mg    Margit HanksALEXANDER, Orvella Digiulio D, MD

## 2013-06-11 NOTE — Progress Notes (Signed)
[facility]  NAME:  Isabel Chaney, Isabel Chaney                  ACCOUNT NO.:  [id number]  MEDICAL RECORD NO.:  [id number]      DATE OF BIRTH:  1920-02-15  PHYSICIAN:  Maxwell CaulMichael G. Tayler Heiden, M.D.      VISIT DATE:                                  OFFICE VISIT   HISTORY OF PRESENT ILLNESS:  Isabel Chaney is a frail 80 year old woman who [facility].  She comes to us for review of a wound on her left lateral heel.  This according to her son-in-law was present and has been there for 2 months.  I really do not have much more information on this.  The son-in-law had actually told us that she was in ER 2 days ago after a fall and they actually may have done x-rays of the area, however, only after the fact that I realized that they are with [facility] therefore I really cannot see the x-rays.  In any case, the facility appears to be using a Santyl based dressing.  PAST MEDICAL HISTORY:  Depression with anxiety, hypothyroidism, chronic anemia, osteoporosis, chronic renal failure, systolic heart failure, history of metabolic encephalopathy.  Chronic bilateral pressure ulcers on the heel.  MEDICATION LIST:  Reviewed.  As noted, the patient is receiving Santyl to the heel.  PHYSICAL EXAMINATION:  This was limited by really significant patient discomfort as she was uncomfortable in the wheelchair complaining of pain on her lower back and bottom.  Therefore, I really had trouble getting even a good look at the wound.  On physical exam, temperature is 98.5, pulse 73, respirations 17, blood pressure 141/81.  Vascular assessment we were unable to obtain her ABIs probably related to comfort.  Peripheral pulses were not palpable.  Capillary refill time is delayed.  On examination, the areas on the posterolateral aspect of the left heel this measures 0.6 x 0.5 x 0.2.  Unfortunately with her sitting in the chair and her discomfort, I really had trouble getting a  good look at this.  I was however impressed with the degree of discomfort palpating around this area, although the patient was complaining of widespread pain too and it is difficult to be certain of that finding.  IMPRESSION:  Pressure ulcer over the left heel.  We changed her dressing to silver collagen with a foam covering Kerlix and net.  This can be changed 3 times a week.  This needs to be carefully off-loaded in bed as her feet tend to roll on this area.  I did attempt to probe this and I really do not think it goes down that far although once again she seemed to be uncomfortable which raised the possibility of underlying tissue injury.      Likelihood of significant PAD also exists         ______________________________ Maxwell CaulMichael G. Shakthi Scipio, M.D.     MGR/MEDQ  D:  06/10/2013  T:  06/11/2013  Job:  [id number]

## 2013-06-11 NOTE — Assessment & Plan Note (Signed)
Chronic, on B12 and ferrous sulfate

## 2013-06-11 NOTE — Assessment & Plan Note (Signed)
Continue zoloft and xanax 

## 2013-06-11 NOTE — Assessment & Plan Note (Signed)
Continue levothyroxine 50 mcg  

## 2013-06-11 NOTE — Assessment & Plan Note (Signed)
Kliebsiella sensitive to cipro

## 2013-06-11 NOTE — Assessment & Plan Note (Addendum)
Chronic of both heels-wound L heel;routine wound care

## 2013-06-11 NOTE — Assessment & Plan Note (Signed)
Cr 1.1 on discharge

## 2013-06-11 NOTE — Assessment & Plan Note (Signed)
Continue demedex and coreg

## 2013-06-11 NOTE — Assessment & Plan Note (Signed)
On lasix and demedex but not controlled -will start norvasc 10 mg

## 2013-06-24 ENCOUNTER — Encounter (HOSPITAL_BASED_OUTPATIENT_CLINIC_OR_DEPARTMENT_OTHER): Payer: Medicare Other | Attending: Internal Medicine

## 2013-06-24 DIAGNOSIS — L8992 Pressure ulcer of unspecified site, stage 2: Secondary | ICD-10-CM | POA: Insufficient documentation

## 2013-06-24 DIAGNOSIS — L89609 Pressure ulcer of unspecified heel, unspecified stage: Secondary | ICD-10-CM | POA: Insufficient documentation

## 2013-07-12 ENCOUNTER — Other Ambulatory Visit: Payer: Self-pay | Admitting: *Deleted

## 2013-07-12 MED ORDER — FENTANYL 12 MCG/HR TD PT72: MEDICATED_PATCH | TRANSDERMAL | Status: DC

## 2013-07-12 NOTE — Telephone Encounter (Signed)
[facility] 

## 2013-07-19 ENCOUNTER — Encounter: Payer: Self-pay | Admitting: Adult Health

## 2013-07-19 ENCOUNTER — Non-Acute Institutional Stay (SKILLED_NURSING_FACILITY): Payer: Commercial Managed Care - HMO | Admitting: Adult Health

## 2013-07-19 DIAGNOSIS — I5022 Chronic systolic (congestive) heart failure: Secondary | ICD-10-CM

## 2013-07-19 DIAGNOSIS — I10 Essential (primary) hypertension: Secondary | ICD-10-CM

## 2013-07-19 DIAGNOSIS — F3289 Other specified depressive episodes: Secondary | ICD-10-CM

## 2013-07-19 DIAGNOSIS — N390 Urinary tract infection, site not specified: Secondary | ICD-10-CM

## 2013-07-19 DIAGNOSIS — F32A Depression, unspecified: Secondary | ICD-10-CM

## 2013-07-19 DIAGNOSIS — F329 Major depressive disorder, single episode, unspecified: Secondary | ICD-10-CM

## 2013-07-19 DIAGNOSIS — E039 Hypothyroidism, unspecified: Secondary | ICD-10-CM

## 2013-07-19 DIAGNOSIS — F411 Generalized anxiety disorder: Secondary | ICD-10-CM

## 2013-07-19 DIAGNOSIS — I509 Heart failure, unspecified: Secondary | ICD-10-CM

## 2013-07-19 DIAGNOSIS — J309 Allergic rhinitis, unspecified: Secondary | ICD-10-CM

## 2013-07-19 NOTE — Progress Notes (Signed)
This encounter was created in error - please disregard.

## 2013-07-22 ENCOUNTER — Ambulatory Visit (HOSPITAL_BASED_OUTPATIENT_CLINIC_OR_DEPARTMENT_OTHER): Payer: Medicare Other

## 2013-08-16 ENCOUNTER — Encounter: Payer: Self-pay | Admitting: *Deleted

## 2013-08-23 ENCOUNTER — Other Ambulatory Visit: Payer: Self-pay | Admitting: *Deleted

## 2013-08-23 MED ORDER — FENTANYL 12 MCG/HR TD PT72: MEDICATED_PATCH | TRANSDERMAL | Status: DC

## 2013-08-23 NOTE — Telephone Encounter (Signed)
[facility] 

## 2013-09-08 ENCOUNTER — Non-Acute Institutional Stay (SKILLED_NURSING_FACILITY): Payer: Commercial Managed Care - HMO | Admitting: Adult Health

## 2013-09-08 ENCOUNTER — Encounter: Payer: Self-pay | Admitting: Adult Health

## 2013-09-08 DIAGNOSIS — D509 Iron deficiency anemia, unspecified: Secondary | ICD-10-CM

## 2013-09-08 DIAGNOSIS — I10 Essential (primary) hypertension: Secondary | ICD-10-CM

## 2013-09-08 DIAGNOSIS — F329 Major depressive disorder, single episode, unspecified: Secondary | ICD-10-CM

## 2013-09-08 DIAGNOSIS — F32A Depression, unspecified: Secondary | ICD-10-CM

## 2013-09-08 DIAGNOSIS — K5900 Constipation, unspecified: Secondary | ICD-10-CM

## 2013-09-08 DIAGNOSIS — F411 Generalized anxiety disorder: Secondary | ICD-10-CM

## 2013-09-08 DIAGNOSIS — I509 Heart failure, unspecified: Secondary | ICD-10-CM

## 2013-09-08 DIAGNOSIS — F3289 Other specified depressive episodes: Secondary | ICD-10-CM

## 2013-09-08 DIAGNOSIS — E039 Hypothyroidism, unspecified: Secondary | ICD-10-CM

## 2013-09-08 DIAGNOSIS — I5022 Chronic systolic (congestive) heart failure: Secondary | ICD-10-CM

## 2013-09-08 DIAGNOSIS — J309 Allergic rhinitis, unspecified: Secondary | ICD-10-CM

## 2013-09-08 NOTE — Progress Notes (Signed)
Patient ID: Isabel Chaney, female   DOB: 02/01/1920, 80 y.o.   MRN: [id number]                PROGRESS NOTE  DATE:   07/19/13  FACILITY: Nursing Home Location: [facility]  LEVEL OF CARE: SNF (31)   Routine Visit  CHIEF COMPLAINT:  Manage CHF, Hypothyroidism and Depression  HISTORY OF PRESENT ILLNESS:   REASSESSMENT OF ONGOING PROBLEM(S):  HTN: Pt 's HTN remains stable.  Denies CP, sob, DOE, pedal edema, headaches, dizziness or visual disturbances.  No complications from the medications currently being used.  Last BP : 122/65  HYPOTHYROIDISM: The hypothyroidism remains stable. No complications noted from the medications presently being used.  The patient denies fatigue or constipation.  4/15 TSH 4.2050  ANEMIA: The anemia has been stable. The patient denies fatigue, melena or hematochezia. No complications from the medications currently being used.4/15 hgb 11.6   PAST MEDICAL HISTORY : Reviewed.  No changes.  [facility]S: [facility]  REVIEW OF SYSTEMS:  GENERAL: no change in appetite, no fatigue, no weight changes, no fever, chills or weakness EARS: decreased hearing on both ears RESPIRATORY: no cough, SOB, DOE, wheezing, hemoptysis CARDIAC: no chest pain, edema or palpitations GI: no abdominal pain, diarrhea, constipation, heart burn, nausea or vomiting  PHYSICAL EXAMINATION    GENERAL: no acute distress, normal body habitus NECK: supple, trachea midline, no neck masses, no thyroid tenderness, no thyromegaly RESPIRATORY: breathing is even & unlabored, BS CTAB CARDIAC: RRR, no murmur,no extra heart sounds, no edema GI: abdomen soft, normal BS, no masses, no tenderness, no hepatomegaly, no splenomegaly PSYCHIATRIC: the patient is alert & oriented to person, affect & behavior appropriate  LABS/RADIOLOGY: 07/16/13 Urine culture > 100,000 CFU/ml E. Coli 06/29/13  sodium 137 potassium 3.9 glucose 92 BUN 34 creatinine 1.0 calcium 9.2 06/16/13   sodium 138 potassium 4.0 glucose 101 BUN 45 creatinine 1.4 calcium 9.7 WBC 6.3 hemoglobin 11.6 hematocrit 37.3 03/10/13 WBC 4.7 hemoglobin 9.8 hematocrit 31.5 03/02/13 WBC 4.4 hemoglobin 10.9 hematocrit 35.0 01/28/13 BNP 158 TSH 2.5230 01/05/13 WBC 4.2 hemoglobin 12.0 hematocrit 37.9 sodium 137 potassium 4.4 glucose 129 BUN 42 creatinine 1.0 calcium 10.1 TSH 2.0 910  ASSESSMENT/PLAN:  CHF - well-compensated; continue Demadex and Coreg  Hypothyroidism - continue Synthroid Depression - stable; continue Zoloft Allergic Rhinitis - stable; continue Allegra Constipation - no complaints; continue MiraLax Anxiety - stable; continue Xanax PRN Anemia - discontinue FeSO4; CBC in 2 weeks UTI - start Bactrim DS 1 tab PO BID X 7 days   CPT CODE: [id number]  Ella BodoMonina Vargas - NP [facility] [phone]

## 2013-09-08 NOTE — Progress Notes (Signed)
Patient ID: Isabel Chaney, female   DOB: 09/06/1919, 80 y.o.   MRN: [id number]         PROGRESS NOTE  DATE:   09/08/13  FACILITY: Nursing Home [facility]  LEVEL OF CARE: SNF (31)   Routine Visit  CHIEF COMPLAINT:  Manage CHF, Hypothyroidism and Depression  HISTORY OF PRESENT ILLNESS:   REASSESSMENT OF ONGOING PROBLEM(S):  HTN: Pt 's HTN remains stable.  Denies CP, sob, DOE, pedal edema, headaches, dizziness or visual disturbances.  No complications from the medications currently being used.  Last BP : 128/72  DEPRESSION: The depression remains stable. Patient denies ongoing feelings of sadness, insomnia, anedhonia or lack of appetite. No complications reported from the medications currently being used. Staff do not report behavioral problems.  CHF:The patient does not relate significant weight changes, denies sob, DOE, orthopnea, PNDs, pedal edema, palpitations or chest pain.  CHF remains stable.  No complications form the medications being used.   PAST MEDICAL HISTORY : Reviewed.  No changes.  CURRENT MEDICATIONS: [facility]  REVIEW OF SYSTEMS:  GENERAL: no change in appetite, no fatigue, no weight changes, no fever, chills or weakness EARS: decreased hearing on both ears RESPIRATORY: no cough, SOB, DOE, wheezing, hemoptysis CARDIAC: no chest pain, edema or palpitations GI: no abdominal pain, diarrhea, constipation, heart burn, nausea or vomiting  PHYSICAL EXAMINATION    GENERAL: no acute distress, normal body habitus NECK: supple, trachea midline, no neck masses, no thyroid tenderness, no thyromegaly LYMPHATIC: No adenopathy in the cervical, supra-clavicular and axillary areas RESPIRATORY: breathing is even & unlabored, BS CTAB CARDIAC: RRR, no murmur,no extra heart sounds, no edema GI: abdomen soft, normal BS, no masses, no tenderness, no hepatomegaly, no splenomegaly PSYCHIATRIC: the patient is alert & oriented to person, affect &  behavior appropriate  LABS/RADIOLOGY: 08/02/13  WBC 4.1 hemoglobin 10.2 hematocrit 32.4 07/16/13 Urine culture > 100,000 CFU/ml E. Coli 06/29/13  sodium 137 potassium 3.9 glucose 92 BUN 34 creatinine 1.0 calcium 9.2 06/16/13  sodium 138 potassium 4.0 glucose 101 BUN 45 creatinine 1.4 calcium 9.7 WBC 6.3 hemoglobin 11.6 hematocrit 37.3 03/10/13 WBC 4.7 hemoglobin 9.8 hematocrit 31.5 03/02/13 WBC 4.4 hemoglobin 10.9 hematocrit 35.0 01/28/13 BNP 158 TSH 2.5230 01/05/13 WBC 4.2 hemoglobin 12.0 hematocrit 37.9 sodium 137 potassium 4.4 glucose 129 BUN 42 creatinine 1.0 calcium 10.1 TSH 2.0 910  ASSESSMENT/PLAN:  Chronic Systolic CHF - well-compensated; continue Demadex and Coreg  Hypothyroidism - continue Synthroid Depression - stable; continue Zoloft Allergic Rhinitis - stable; continue Allegra Constipation - no complaints; continue MiraLax Anxiety - stable; continue Xanax PRN Anemia - continue FeSO4; scheduled for repeat CBC   CPT CODE: [id number]  Ella BodoMonina Vargas - NP [facility] [phone]

## 2013-09-13 ENCOUNTER — Other Ambulatory Visit: Payer: Self-pay | Admitting: *Deleted

## 2013-09-13 MED ORDER — FENTANYL 12 MCG/HR TD PT72: MEDICATED_PATCH | TRANSDERMAL | Status: DC

## 2013-09-13 NOTE — Telephone Encounter (Signed)
[facility] 

## 2013-09-14 ENCOUNTER — Non-Acute Institutional Stay (SKILLED_NURSING_FACILITY): Payer: Commercial Managed Care - HMO | Admitting: Adult Health

## 2013-09-14 ENCOUNTER — Encounter: Payer: Self-pay | Admitting: Adult Health

## 2013-09-14 DIAGNOSIS — N390 Urinary tract infection, site not specified: Secondary | ICD-10-CM

## 2013-09-14 NOTE — Progress Notes (Signed)
Patient ID: Isabel Chaney, female   DOB: 12/25/1919, 80 y.o.   MRN: [id number]         PROGRESS NOTE  DATE:   09/14/13  FACILITY: Nursing Home [facility]  LEVEL OF CARE: SNF (31)   Acute Visit  CHIEF COMPLAINT:  Manage UTI  HISTORY OF PRESENT ILLNESS: This is a 80 year old female who complains of dysuria. No fever nor hematuria noted. Urine culture showed > 100,000 CFU/ml E. Coli, ESBL+ .   PAST MEDICAL HISTORY : Reviewed.  No changes.  CURRENT MEDICATIONS: [facility]  REVIEW OF SYSTEMS:  GENERAL: no change in appetite, no fatigue, no weight changes, no fever, chills or weakness EARS: decreased hearing on both ears RESPIRATORY: no cough, SOB, DOE, wheezing, hemoptysis CARDIAC: no chest pain, edema or palpitations GU:  + dysuria   PHYSICAL EXAMINATION    GENERAL: no acute distress, normal body habitus NECK: supple, trachea midline, no neck masses, no thyroid tenderness, no thyromegaly RESPIRATORY: breathing is even & unlabored, BS CTAB CARDIAC: RRR, no murmur,no extra heart sounds, no edema GI: abdomen soft, normal BS, no masses, no tenderness, no hepatomegaly, no splenomegaly PSYCHIATRIC: the patient is alert & oriented to person, affect & behavior appropriate  LABS/RADIOLOGY: 08/02/13  WBC 4.1 hemoglobin 10.2 hematocrit 32.4 07/16/13 Urine culture > 100,000 CFU/ml E. Coli 06/29/13  sodium 137 potassium 3.9 glucose 92 BUN 34 creatinine 1.0 calcium 9.2 06/16/13  sodium 138 potassium 4.0 glucose 101 BUN 45 creatinine 1.4 calcium 9.7 WBC 6.3 hemoglobin 11.6 hematocrit 37.3 03/10/13 WBC 4.7 hemoglobin 9.8 hematocrit 31.5 03/02/13 WBC 4.4 hemoglobin 10.9 hematocrit 35.0 01/28/13 BNP 158 TSH 2.5230 01/05/13 WBC 4.2 hemoglobin 12.0 hematocrit 37.9 sodium 137 potassium 4.4 glucose 129 BUN 42 creatinine 1.0 calcium 10.1 TSH 2.0 910  ASSESSMENT/PLAN:  UTI - start Bactrim DS 1 tab PO BID X 7 days  CPT CODE: [id number]  Ella BodoMonina Vargas - NP [facility] [phone]

## 2013-09-20 LAB — CBC AND DIFFERENTIAL
HEMATOCRIT: 32 % — AB (ref 36–46)
Hemoglobin: 10.3 g/dL — AB (ref 12.0–16.0)
Platelets: 167 10*3/uL (ref 150–399)
WBC: 3.2 10^3/mL

## 2013-09-29 ENCOUNTER — Other Ambulatory Visit: Payer: Self-pay | Admitting: *Deleted

## 2013-09-29 MED ORDER — ALPRAZOLAM 1 MG PO TABS: ORAL_TABLET | ORAL | Status: DC

## 2013-09-29 NOTE — Telephone Encounter (Signed)
[facility] 

## 2013-10-06 ENCOUNTER — Non-Acute Institutional Stay (SKILLED_NURSING_FACILITY): Payer: Commercial Managed Care - HMO | Admitting: Internal Medicine

## 2013-10-06 DIAGNOSIS — D631 Anemia in chronic kidney disease: Secondary | ICD-10-CM | POA: Insufficient documentation

## 2013-10-06 DIAGNOSIS — I509 Heart failure, unspecified: Secondary | ICD-10-CM

## 2013-10-06 DIAGNOSIS — N039 Chronic nephritic syndrome with unspecified morphologic changes: Secondary | ICD-10-CM

## 2013-10-06 DIAGNOSIS — E039 Hypothyroidism, unspecified: Secondary | ICD-10-CM

## 2013-10-06 DIAGNOSIS — N189 Chronic kidney disease, unspecified: Secondary | ICD-10-CM

## 2013-10-06 DIAGNOSIS — I5022 Chronic systolic (congestive) heart failure: Secondary | ICD-10-CM

## 2013-10-06 DIAGNOSIS — I150 Renovascular hypertension: Secondary | ICD-10-CM

## 2013-10-06 NOTE — Progress Notes (Signed)
         PROGRESS NOTE  DATE: 10/06/2013  FACILITY: Nursing Home Location: [facility]  LEVEL OF CARE: SNF (31)  Routine Visit  CHIEF COMPLAINT:  Manage hypothyroidism, CHF and hypertension  HISTORY OF PRESENT ILLNESS:  REASSESSMENT OF ONGOING PROBLEM(S):  CHF:The patient does not relate significant weight changes, denies sob, DOE, orthopnea, PNDs, pedal edema, palpitations or chest pain.  CHF remains stable.  No complications form the medications being used.  HYPOTHYROIDISM: The hypothyroidism remains stable. No complications noted from the medications presently being used.  The patient denies fatigue or constipation.  Last TSH is 4.205 in 4-15.  HTN: Pt 's HTN remains stable.  Denies CP, sob, DOE, pedal edema, headaches, dizziness or visual disturbances.  No complications from the medications currently being used.  Last BP : 122/89.  PAST MEDICAL HISTORY : Reviewed.  No changes/see problem list  CURRENT MEDICATIONS: Reviewed per MAR/see medication list  REVIEW OF SYSTEMS:  GENERAL: no change in appetite, no fatigue, no weight changes, no fever, chills or weakness RESPIRATORY: no cough, SOB, DOE, wheezing, hemoptysis CARDIAC: no chest pain, edema or palpitations GI: no abdominal pain, diarrhea, constipation, heart burn, nausea or vomiting  PHYSICAL EXAMINATION  VS:  See VS section  GENERAL: no acute distress, moderately obese body habitus EYES: conjunctivae normal, sclerae normal, normal eye lids NECK: supple, trachea midline, no neck masses, no thyroid tenderness, no thyromegaly LYMPHATICS: no LAN in the neck, no supraclavicular LAN RESPIRATORY: breathing is even & unlabored, BS CTAB CARDIAC: RRR, no murmur,no extra heart sounds, +1 bilateral lower extremity edema GI: abdomen soft, normal BS, no masses, no tenderness, no hepatomegaly, no splenomegaly PSYCHIATRIC: the patient is alert & oriented to person, affect & behavior  appropriate  LABS/RADIOLOGY: 7-15 WBC 3.2, hemoglobin 10.3, MCV 88.9, platelets 167 4-15 BUN 34 otherwise BMP normal  ASSESSMENT/PLAN:  Hypothyroidism-well-controlled CHF-compensated Renovascular hypertension-well-controlled Anemia of chronic kidney disease-continue iron. Constipation-well-controlled Anxiety-Xanax was started Check liver profile  CPT CODE: [id number]  Angela CoxGayani Y Emelina Hinch, MD [facility] [phone]

## 2013-11-04 ENCOUNTER — Non-Acute Institutional Stay (SKILLED_NURSING_FACILITY): Payer: Commercial Managed Care - HMO | Admitting: Adult Health

## 2013-11-04 ENCOUNTER — Encounter: Payer: Self-pay | Admitting: Adult Health

## 2013-11-04 DIAGNOSIS — E039 Hypothyroidism, unspecified: Secondary | ICD-10-CM

## 2013-11-04 DIAGNOSIS — K5900 Constipation, unspecified: Secondary | ICD-10-CM

## 2013-11-04 DIAGNOSIS — N039 Chronic nephritic syndrome with unspecified morphologic changes: Secondary | ICD-10-CM

## 2013-11-04 DIAGNOSIS — F411 Generalized anxiety disorder: Secondary | ICD-10-CM

## 2013-11-04 DIAGNOSIS — I509 Heart failure, unspecified: Secondary | ICD-10-CM

## 2013-11-04 DIAGNOSIS — I5022 Chronic systolic (congestive) heart failure: Secondary | ICD-10-CM

## 2013-11-04 DIAGNOSIS — D631 Anemia in chronic kidney disease: Secondary | ICD-10-CM

## 2013-11-04 DIAGNOSIS — I150 Renovascular hypertension: Secondary | ICD-10-CM

## 2013-11-04 DIAGNOSIS — N390 Urinary tract infection, site not specified: Secondary | ICD-10-CM

## 2013-11-04 NOTE — Progress Notes (Signed)
Patient ID: Isabel Chaney, female   DOB: 06/01/1919, 80 y.o.   MRN: [id number]               PROGRESS NOTE  DATE:    11/04/13  FACILITY: Nursing Home [facility]  LEVEL OF CARE: SNF (31)  Routine Visit  CHIEF COMPLAINT:  Manage hypothyroidism, CHF, UTI and hypertension  HISTORY OF PRESENT ILLNESS:  REASSESSMENT OF ONGOING PROBLEM(S):  HTN: Pt 's HTN remains stable.  Denies CP, sob, DOE, pedal edema, headaches, dizziness or visual disturbances.  No complications from the medications currently being used.  Last BP : 143/60  ANEMIA: The anemia has been stable. The patient denies fatigue, melena or hematochezia. No complications from the medications currently being used. 7/15 hgb 10.3  ANXIETY: The anxiety remains stable. Patient denies ongoing anxiety or irritability. No complications reported from the medications currently being used.   PAST MEDICAL HISTORY : Reviewed.  No changes/see problem list  CURRENT MEDICATIONS: Reviewed per MAR/see medication list  REVIEW OF SYSTEMS:  GENERAL: no change in appetite, no fatigue, no weight changes, no fever, chills or weakness RESPIRATORY: no cough, SOB, DOE, wheezing, hemoptysis CARDIAC: no chest pain, edema or palpitations GI: no abdominal pain, diarrhea, constipation, heart burn, nausea or vomiting  PHYSICAL EXAMINATION  VS:  See VS section  GENERAL: no acute distress, moderately obese body habitus NECK: supple, trachea midline, no neck masses, no thyroid tenderness, no thyromegaly LYMPHATICS: no LAN in the neck, no supraclavicular LAN RESPIRATORY: breathing is even & unlabored, BS CTAB CARDIAC: RRR, no murmur,no extra heart sounds, +1 bilateral lower extremity edema GI: abdomen soft, normal BS, no masses, no tenderness, no hepatomegaly, no splenomegaly PSYCHIATRIC: the patient is alert & oriented to person, affect & behavior appropriate  LABS/RADIOLOGY: 7-15 WBC 3.2, hemoglobin 10.3, MCV 88.9, platelets  167 4-15 BUN 34 otherwise BMP normal  ASSESSMENT/PLAN:  Hypothyroidism - well-controlled CHF- compensated Renovascular hypertension - well-controlled Anemia of chronic kidney disease - continue iron. Constipation - well-controlled Anxiety - stable; continue Xanax  UTI - urine culture shows > 100,000 CFU/ml Klebsiella pneumoniae; start Macrobid 100 mg by mouth twice a day x7 days   CPT CODE: [id number]  Ella BodoMonina Vargas - NP [facility] [phone]

## 2013-12-02 ENCOUNTER — Encounter: Payer: Self-pay | Admitting: Adult Health

## 2013-12-02 ENCOUNTER — Non-Acute Institutional Stay (SKILLED_NURSING_FACILITY): Payer: Commercial Managed Care - HMO | Admitting: Adult Health

## 2013-12-02 DIAGNOSIS — I509 Heart failure, unspecified: Secondary | ICD-10-CM

## 2013-12-02 DIAGNOSIS — I5022 Chronic systolic (congestive) heart failure: Secondary | ICD-10-CM

## 2013-12-02 DIAGNOSIS — K5900 Constipation, unspecified: Secondary | ICD-10-CM

## 2013-12-02 DIAGNOSIS — F419 Anxiety disorder, unspecified: Secondary | ICD-10-CM

## 2013-12-02 DIAGNOSIS — E039 Hypothyroidism, unspecified: Secondary | ICD-10-CM

## 2013-12-02 DIAGNOSIS — F32A Depression, unspecified: Secondary | ICD-10-CM

## 2013-12-02 DIAGNOSIS — J309 Allergic rhinitis, unspecified: Secondary | ICD-10-CM

## 2013-12-02 DIAGNOSIS — G8929 Other chronic pain: Secondary | ICD-10-CM

## 2013-12-02 DIAGNOSIS — F411 Generalized anxiety disorder: Secondary | ICD-10-CM

## 2013-12-02 DIAGNOSIS — F329 Major depressive disorder, single episode, unspecified: Secondary | ICD-10-CM

## 2013-12-02 DIAGNOSIS — I150 Renovascular hypertension: Secondary | ICD-10-CM

## 2013-12-02 DIAGNOSIS — F3289 Other specified depressive episodes: Secondary | ICD-10-CM

## 2013-12-02 NOTE — Progress Notes (Signed)
Patient ID: TEREKA THORLEY, female   DOB: 11-30-1919, 80 y.o.   MRN: [id number]              PROGRESS NOTE  DATE:       12/02/13  FACILITY: Nursing Home Location: [facility]  LEVEL OF CARE: SNF (31)  Routine Visit  CHIEF COMPLAINT:  Manage hypothyroidism, CHF and hypertension  HISTORY OF PRESENT ILLNESS:  REASSESSMENT OF ONGOING PROBLEM(S):  HYPOTHYROIDISM: The hypothyroidism remains stable. No complications noted from the medications presently being used.  The patient denies fatigue or constipation.  Last TSH not available  CHF:The patient does not relate significant weight changes, denies sob, DOE, orthopnea, PNDs, pedal edema, palpitations or chest pain.  CHF remains stable.  No complications form the medications being used.  DEPRESSION: The depression remains stable. Patient denies ongoing feelings of sadness, insomnia, anedhonia or lack of appetite. No complications reported from the medications currently being used. Staff do not report behavioral problems.  PAST MEDICAL HISTORY : Reviewed.  No changes/see problem list  CURRENT MEDICATIONS: Reviewed per MAR/see medication list  REVIEW OF SYSTEMS:  GENERAL: no change in appetite, no fatigue, no weight changes, no fever, chills or weakness RESPIRATORY: no cough, SOB, DOE, wheezing, hemoptysis CARDIAC: no chest pain, edema or palpitations GI: no abdominal pain, diarrhea, constipation, heart burn, nausea or vomiting  PHYSICAL EXAMINATION  GENERAL: no acute distress, moderately obese body habitus NECK: supple, trachea midline, no neck masses, no thyroid tenderness, no thyromegaly RESPIRATORY: breathing is even & unlabored, BS CTAB CARDIAC: RRR, no murmur,no extra heart sounds, +1 bilateral lower extremity edema GI: abdomen soft, normal BS, no masses, no tenderness, no hepatomegaly, no splenomegaly EXTREMITIES:  Able to move all 4 extremities; BUE 5/5 strength and BLE 3/5 strength PSYCHIATRIC: the patient is  alert & oriented to person, affect & behavior appropriate  LABS/RADIOLOGY: 7-15 WBC 3.2, hemoglobin 10.3, MCV 88.9, platelets 167 4-15 BUN 34 otherwise BMP normal  ASSESSMENT/PLAN:  Hypothyroidism - continue Synthroid; check TSH Systolic CHF- compensated; continue Demadex; check BMP Renovascular hypertension - well-controlled; continue Coreg. Constipation - well-controlled; continue MiraLax Anxiety - stable; continue Xanax  Allergic rhinitis - stable; continue Allegra Chronic pain - stable; continue Duragesic patch Depression - stable; continue Zoloft   CPT CODE: 72536  Ella Bodo - NP [facility] [phone]

## 2013-12-17 ENCOUNTER — Other Ambulatory Visit: Payer: Self-pay | Admitting: *Deleted

## 2013-12-17 MED ORDER — ALPRAZOLAM 0.5 MG PO TABS: ORAL_TABLET | ORAL | Status: DC

## 2013-12-17 NOTE — Telephone Encounter (Signed)
[facility] 

## 2013-12-21 ENCOUNTER — Other Ambulatory Visit: Payer: Self-pay | Admitting: *Deleted

## 2013-12-21 MED ORDER — ALPRAZOLAM 0.5 MG PO TABS: ORAL_TABLET | ORAL | Status: DC

## 2013-12-30 ENCOUNTER — Other Ambulatory Visit: Payer: Self-pay | Admitting: *Deleted

## 2013-12-30 MED ORDER — OXYCODONE-ACETAMINOPHEN 5-325 MG PO TABS: ORAL_TABLET | ORAL | Status: DC

## 2013-12-30 NOTE — Telephone Encounter (Signed)
[facility] 

## 2014-04-16 ENCOUNTER — Non-Acute Institutional Stay (SKILLED_NURSING_FACILITY): Payer: Commercial Managed Care - HMO | Admitting: Adult Health

## 2014-04-16 ENCOUNTER — Encounter: Payer: Self-pay | Admitting: Adult Health

## 2014-04-16 DIAGNOSIS — F32A Depression, unspecified: Secondary | ICD-10-CM

## 2014-04-16 DIAGNOSIS — K5900 Constipation, unspecified: Secondary | ICD-10-CM

## 2014-04-16 DIAGNOSIS — G8929 Other chronic pain: Secondary | ICD-10-CM

## 2014-04-16 DIAGNOSIS — I10 Essential (primary) hypertension: Secondary | ICD-10-CM

## 2014-04-16 DIAGNOSIS — J309 Allergic rhinitis, unspecified: Secondary | ICD-10-CM

## 2014-04-16 DIAGNOSIS — F329 Major depressive disorder, single episode, unspecified: Secondary | ICD-10-CM

## 2014-04-16 DIAGNOSIS — N183 Chronic kidney disease, stage 3 unspecified: Secondary | ICD-10-CM

## 2014-04-16 DIAGNOSIS — I5022 Chronic systolic (congestive) heart failure: Secondary | ICD-10-CM

## 2014-04-16 DIAGNOSIS — E039 Hypothyroidism, unspecified: Secondary | ICD-10-CM

## 2014-04-16 DIAGNOSIS — F419 Anxiety disorder, unspecified: Secondary | ICD-10-CM

## 2014-04-16 NOTE — Progress Notes (Signed)
Patient ID: Isabel Chaney, female   DOB: 09/14/1919, 81 y.o.   MRN: [id number]   04/16/2014  Facility:  Nursing Home Location:  [facility] Room Number: 905-1 LEVEL OF CARE:  SNF (31)  Routine Visit  Chief Complaint  Patient presents with  . Medical Management of Chronic Issues    Hypothyroidism, CHF, hypertension, constipation, depression, allergic rhinitis, chronic pain and anxiety    HISTORY OF PRESENT ILLNESS:  This is a 81 year old female long-term [facility]. She is being seen for a routine visit. Demadex was recently changed to PRN since creatinine was noted to be 2.1 . No SOB and has BLE edema 1+. She has PMH of hypothyroidism, CHF, hypertension and CKD stage III.  PAST MEDICAL HISTORY:  Past Medical History  Diagnosis Date  . Hypothyroidism   . Anemia   . Anxiety   . Depression   . Acute on chronic systolic heart failure   . Osteoporosis   . CHF (congestive heart failure)   . Chronic kidney disease, stage III (moderate)   . Hypertension     CURRENT MEDICATIONS: Reviewed per MAR/see medication list  Allergies  Allergen Reactions  . Penicillins     REVIEW OF SYSTEMS:  GENERAL: no change in appetite, no fatigue, no weight changes, no fever, chills or weakness RESPIRATORY: no cough, SOB, DOE, wheezing, hemoptysis CARDIAC: no chest pain, or palpitations GI: no abdominal pain, diarrhea, constipation, heart burn, nausea or vomiting  PHYSICAL EXAMINATION  GENERAL: no acute distress, normal body habitus EYES: conjunctivae normal, sclerae normal, normal eye lids NECK: supple, trachea midline, no neck masses, no thyroid tenderness, no thyromegaly LYMPHATICS: no LAN in the neck, no supraclavicular LAN RESPIRATORY: breathing is even & unlabored, BS CTAB CARDIAC: RRR, no murmur,no extra heart sounds, BLE edema 1+ GI: abdomen soft, normal BS, no masses, no tenderness, no hepatomegaly, no splenomegaly EXTREMITIES: Able to move  4 extremities, ambulates with a wheelchair PSYCHIATRIC: the patient is alert & oriented to person, affect & behavior appropriate  LABS/RADIOLOGY: 04/15/14  sodium 136 potassium 4.5 glucose 95 BUN 49 creatinine 1.7 calcium 10.0 GFR40.50 04/13/14  sodium 135 potassium 5.0 glucose 99 BUN 47 creatinine 2.1 calcium 9.8 GFR 31.01 WBC 4.2 hemoglobin 11.7 hematocrit 35.8 MCV 90.2 TSH 1.85 01/13/14  WBC 5.4 hemoglobin 11.8 hematocrit 36.3 MCV 90.3 7-15 WBC 3.2, hemoglobin 10.3, MCV 88.9, platelets 167 4-15 BUN 34 otherwise BMP normal  ASSESSMENT/PLAN:  Hypothyroidism - TSH 1.85 ; continue Synthroid 50 g by mouth daily Chronic systolic CHF - stable; continue Coreg 3.125 mg by mouth daily and Demadex 10 mg one by mouth daily when necessary Hypertension - well controlled; continue Coreg 3.125 mg 1 tab by mouth daily Constipation - continue MiraLAX 17 mg +4-6 ounces liquid daily Depression - mood is stable; continue Zoloft 75 mg by mouth daily Allergic rhinitis - stable; continue Allegra 180 mg by mouth daily Chronic pain - well controlled; continue Duragesic patch, Percocet and Zanaflex Anxiety - mood is stable; continue Xanax CKD stage III -  Creatinine 1.7; monitor BMP   Goals of care:   long-term care    Labs/test ordered:  none    [facility]-VARGAS,Rina Adney, NP [facility] [phone]

## 2014-04-21 ENCOUNTER — Other Ambulatory Visit: Payer: Self-pay | Admitting: *Deleted

## 2014-04-21 MED ORDER — ALPRAZOLAM 1 MG PO TABS: ORAL_TABLET | ORAL | Status: DC

## 2014-04-21 MED ORDER — ALPRAZOLAM 0.5 MG PO TABS: ORAL_TABLET | ORAL | Status: DC

## 2014-04-21 NOTE — Telephone Encounter (Signed)
[facility] 

## 2014-04-26 ENCOUNTER — Other Ambulatory Visit: Payer: Self-pay | Admitting: *Deleted

## 2014-04-26 MED ORDER — ALPRAZOLAM 0.5 MG PO TABS: ORAL_TABLET | ORAL | Status: DC

## 2014-04-26 NOTE — Telephone Encounter (Signed)
[facility] 

## 2014-04-27 ENCOUNTER — Other Ambulatory Visit: Payer: Self-pay | Admitting: *Deleted

## 2014-04-27 MED ORDER — ALPRAZOLAM 0.5 MG PO TABS: ORAL_TABLET | ORAL | Status: DC

## 2014-04-27 NOTE — Telephone Encounter (Signed)
[facility] 

## 2014-04-28 ENCOUNTER — Other Ambulatory Visit: Payer: Self-pay | Admitting: *Deleted

## 2014-04-28 MED ORDER — ALPRAZOLAM 0.5 MG PO TABS: ORAL_TABLET | ORAL | Status: DC

## 2014-04-28 NOTE — Telephone Encounter (Signed)
[facility] 

## 2014-05-06 ENCOUNTER — Other Ambulatory Visit: Payer: Self-pay

## 2014-05-06 MED ORDER — ALPRAZOLAM 0.5 MG PO TABS: ORAL_TABLET | ORAL | Status: DC

## 2014-05-06 NOTE — Telephone Encounter (Signed)
Rx faxed to [facility] @ [phone], phone number [phone]  

## 2014-05-16 ENCOUNTER — Other Ambulatory Visit: Payer: Self-pay | Admitting: *Deleted

## 2014-05-16 LAB — BASIC METABOLIC PANEL
BUN: 33 mg/dL — AB (ref 4–21)
CREATININE: 1.1 mg/dL (ref <=1.1)
Glucose: 89 mg/dL
Potassium: 3.7 mmol/L (ref 3.4–5.3)
Sodium: 141 mmol/L (ref 137–147)

## 2014-05-16 MED ORDER — FENTANYL 12 MCG/HR TD PT72: MEDICATED_PATCH | TRANSDERMAL | Status: DC

## 2014-05-16 NOTE — Telephone Encounter (Signed)
[facility] 

## 2014-05-20 ENCOUNTER — Other Ambulatory Visit: Payer: Self-pay | Admitting: *Deleted

## 2014-05-20 MED ORDER — ALPRAZOLAM 1 MG PO TABS: ORAL_TABLET | ORAL | Status: DC

## 2014-05-20 MED ORDER — ALPRAZOLAM 0.5 MG PO TABS
ORAL_TABLET | ORAL | Status: DC
Start: 2014-05-20 — End: 2014-07-04

## 2014-05-20 NOTE — Telephone Encounter (Signed)
[facility] 

## 2014-06-14 ENCOUNTER — Encounter: Payer: Self-pay | Admitting: Neurology

## 2014-06-14 ENCOUNTER — Ambulatory Visit (INDEPENDENT_AMBULATORY_CARE_PROVIDER_SITE_OTHER): Payer: Commercial Managed Care - HMO | Admitting: Neurology

## 2014-06-14 VITALS — BP 112/58 | HR 55 | Ht 60.0 in

## 2014-06-14 DIAGNOSIS — R269 Unspecified abnormalities of gait and mobility: Secondary | ICD-10-CM | POA: Diagnosis not present

## 2014-06-14 DIAGNOSIS — R413 Other amnesia: Secondary | ICD-10-CM

## 2014-06-14 HISTORY — DX: Other amnesia: R41.3

## 2014-06-14 HISTORY — DX: Unspecified abnormalities of gait and mobility: R26.9
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
## 2014-06-14 NOTE — Patient Instructions (Signed)

## 2014-06-16 ENCOUNTER — Telehealth: Payer: Self-pay | Admitting: Neurology

## 2014-06-16 LAB — METHYLMALONIC ACID, SERUM: Methylmalonic Acid: 457 nmol/L — ABNORMAL HIGH (ref 0–378)

## 2014-06-16 LAB — VITAMIN B12: Vitamin B-12: 1886 pg/mL — ABNORMAL HIGH (ref 211–946)

## 2014-06-16 LAB — COPPER, SERUM: Copper: 123 ug/dL (ref 72–166)

## 2014-06-16 LAB — RPR: RPR Ser Ql: NONREACTIVE

## 2014-06-16 NOTE — Telephone Encounter (Signed)
I called patient. The blood work was unremarkable with exception that the methylmalonic acid level was elevated, but the vitamin B12 level was well within the normal range. In individuals over the age of 79, elevations can be seen in the methylmalonic acid level that do not correlate with a B12 deficiency. Essentially, the blood work is unremarkable.

## 2014-06-20 ENCOUNTER — Telehealth: Payer: Self-pay | Admitting: Neurology

## 2014-06-20 ENCOUNTER — Ambulatory Visit
Admission: RE | Admit: 2014-06-20 | Discharge: 2014-06-20 | Disposition: A | Discharge status: Home / Self Care | Payer: Commercial Managed Care - HMO | Source: Ambulatory Visit | Attending: Neurology | Admitting: Neurology

## 2014-06-20 DIAGNOSIS — R413 Other amnesia: Secondary | ICD-10-CM

## 2014-06-20 DIAGNOSIS — R269 Unspecified abnormalities of gait and mobility: Secondary | ICD-10-CM | POA: Diagnosis not present

## 2014-06-20 NOTE — Telephone Encounter (Signed)
I called the daughter. The CT the head shows some white matter changes, not much change since 2012. I would recommend simplifying her drug regimen at this point. I would recommend stopping the tizanidine and oxycodone, and gradually reducing the alprazolam dosing. The patient likely has a baseline dementia, but the use of these psychoactive medications may be exacerbating the problem.   CT head 06/20/2014:  IMPRESSION: This is an abnormal CT scan of the head showed the following: 1. Moderate cortical atrophy, minimally progressed since 2012. 2. Moderate periventricular and deep white matter changes consistent with age-related small vessel ischemic disease. The extent is similar to the 2012 study. 3. 12 mm calcified mass in the left frontal extra-axial region likely representing a benign meningioma. It has not changed since 2012.

## 2014-06-22 ENCOUNTER — Encounter: Payer: Self-pay | Admitting: Gastroenterology

## 2014-07-01 ENCOUNTER — Encounter: Payer: Self-pay | Admitting: Gastroenterology

## 2014-07-01 ENCOUNTER — Ambulatory Visit (INDEPENDENT_AMBULATORY_CARE_PROVIDER_SITE_OTHER): Payer: Commercial Managed Care - HMO | Admitting: Gastroenterology

## 2014-07-01 VITALS — BP 110/70 | HR 56

## 2014-07-01 DIAGNOSIS — R1314 Dysphagia, pharyngoesophageal phase: Secondary | ICD-10-CM

## 2014-07-01 DIAGNOSIS — R1310 Dysphagia, unspecified: Secondary | ICD-10-CM | POA: Diagnosis not present

## 2014-07-01 NOTE — Progress Notes (Signed)
_                                                                                                                History of Present Illness:  [name] is a 81 year old white female referred at the request of Dr. Sander RadonPandy for evaluation of swallowing difficulties.  History was obtained through her daughter.  She apparently has difficulties with swallowing characterized by her spitting up stringy mucousy material while eating.  It is unclear whether she's having dysphagia, per se or difficulties initiating a swallow.  She denies abdominal pain or pyrosis.  This has been an intermittent problem over the past couple of years but seems to be worsening.   Past Medical History  Diagnosis Date  . Hypothyroidism   . Anemia   . Anxiety   . Depression   . Acute on chronic systolic heart failure   . Osteoporosis   . CHF (congestive heart failure)   . Chronic kidney disease, stage III (moderate)   . Hypertension   . Memory disorder 06/14/2014  . Abnormality of gait 06/14/2014  . Allergic rhinitis    Past Surgical History  Procedure Laterality Date  . Cataract extraction Bilateral   . Abdominal hysterectomy     family history includes Dementia in her sister; Heart attack in her brother, brother, father, and mother; Heart disease in her brother, brother, sister, and sister; Hypertension in her father and mother; Stroke in her brother. Current Outpatient Prescriptions  Medication Sig Dispense Refill  . ALPRAZolam (XANAX) 0.5 MG tablet Take one tablet by mouth daily at 12 noon 30 tablet 5  . ALPRAZolam (XANAX) 1 MG tablet Take one tablet by mouth three times daily for anxiety 90 tablet 5  . antiseptic oral rinse (BIOTENE) LIQD 15 mLs by Mouth Rinse route as needed for dry mouth.    . carvedilol (COREG) 6.25 MG tablet Take 3.125 mg by mouth daily. For hypertension    . Cholecalciferol (VITAMIN D-3) 1000 UNITS CAPS Take 4,000 Units by mouth daily. For supplements    . Cranberry 450 MG  CAPS Take 1 tablet by mouth daily.    . fentaNYL (DURAGESIC - DOSED MCG/HR) 12 MCG/HR Apply one patch topically every 48 hours for pain. Rotate sites. Check placement of patch every shift. 10 patch 0  . ferrous sulfate 325 (65 FE) MG tablet Take 325 mg by mouth daily with breakfast.    . fexofenadine (ALLEGRA) 180 MG tablet Take 180 mg by mouth daily. For allergies    . hydrALAZINE (APRESOLINE) 10 MG tablet Take 10 mg by mouth 2 (two) times daily.    . Hypromellose (ARTIFICIAL TEARS OP) Apply to eye. Instill 2 drops each eye twice daily for dry eyes. Wait 3-5 minutes between 2 eye medications    . levothyroxine (SYNTHROID, LEVOTHROID) 50 MCG tablet Take 50 mcg by mouth daily before breakfast. For Hypothyroid, check pulse weekly    . Menthol, Topical Analgesic, (BIOFREEZE) 4 % GEL Apply topically 2 (two) times daily as  needed. For bilateral extremity joints.    . Multiple Vitamins-Minerals (DECUBI-VITE PO) Take by mouth daily. For wound healing    . oxyCODONE-acetaminophen (PERCOCET/ROXICET) 5-325 MG per tablet Take one tablet by mouth twice daily; Take one tablet by mouth every 4 hours as needed for pain 240 tablet 0  . polyethylene glycol (MIRALAX / GLYCOLAX) packet Take 17 g by mouth daily. 4-8 oz of liquid daily for constipation *HOLD for diarrhea*    . polyvinyl alcohol (LIQUIFILM TEARS) 1.4 % ophthalmic solution Place 1 drop into both eyes 2 (two) times daily.    . risperiDONE (RISPERDAL) 0.5 MG tablet Take 0.5 mg by mouth 2 (two) times daily.    . sertraline (ZOLOFT) 50 MG tablet Take 50 mg by mouth daily. For depression    . tiZANidine (ZANAFLEX) 2 MG tablet Take 2 mg by mouth every 6 (six) hours as needed for muscle spasms.    Marland Kitchen torsemide (DEMADEX) 10 MG tablet Take 10 mg by mouth daily. For hypertension    . vitamin B-12 (CYANOCOBALAMIN) 1000 MCG tablet Take 1,000 mcg by mouth daily. For B-12 supplement     No current facility-administered medications for this visit.   Allergies as of  07/01/2014 - Review Complete 07/01/2014  Allergen Reaction Noted  . Penicillins  05/18/2013    reports that she quit smoking about 50 years ago. She has never used smokeless tobacco. She reports that she does not drink alcohol or use illicit drugs.   Review of Systems: Pertinent positive and negative review of systems were noted in the above HPI section. All other review of systems were otherwise negative.  Vital signs were reviewed in today's medical record Physical Exam: General: Well developed , well nourished, no acute distress Skin: anicteric Head: Normocephalic and atraumatic Eyes:  sclerae anicteric, EOMI Ears: Normal auditory acuity Mouth: No deformity or lesions Neck: Supple, no masses or thyromegaly Lungs: Clear throughout to auscultation Heart: Regular rate and rhythm; no murmurs, rubs or bruits Abdomen: Soft, non tender and non distended. No masses, hepatosplenomegaly or hernias noted. Normal Bowel sounds Rectal:deferred Musculoskeletal: Symmetrical with no gross deformities  Skin: No lesions on visible extremities Pulses:  Normal pulses noted Extremities: No clubbing, cyanosis, edema or deformities noted Neurological: Alert oriented x 4, grossly nonfocal Cervical Nodes:  No significant cervical adenopathy Inguinal Nodes: No significant inguinal adenopathy Psychological:  Alert and cooperative. Normal mood and affect  See Assessment and Plan under Problem List

## 2014-07-01 NOTE — Assessment & Plan Note (Signed)
I suspect the patient is suffering from limbs with deglutition or a nonspecific motility disorder of the esophagus.  A fixed stricture is also a possibility.  Recommendations #1  Barium swallow #2 to consider swallowing evaluation pending results of #1

## 2014-07-01 NOTE — Patient Instructions (Signed)
You have been scheduled for a Barium Esophogram at [facility] (1st floor of the hospital) on 07/11/2014 at 10:30AM. Please arrive 15 minutes prior to your appointment for registration. Make certain not to have anything to eat or drink 6 hours prior to your test. If you need to reschedule for any reason, please contact radiology at [phone] to do so. __________________________________________________________________ A barium swallow is an examination that concentrates on views of the esophagus. This tends to be a double contrast exam (barium and two liquids which, when combined, create a gas to distend the wall of the oesophagus) or single contrast (non-ionic iodine based). The study is usually tailored to your symptoms so a good history is essential. Attention is paid during the study to the form, structure and configuration of the esophagus, looking for functional disorders (such as aspiration, dysphagia, achalasia, motility and reflux) EXAMINATION You may be asked to change into a gown, depending on the type of swallow being performed. A radiologist and radiographer will perform the procedure. The radiologist will advise you of the type of contrast selected for your procedure and direct you during the exam. You will be asked to stand, sit or lie in several different positions and to hold a small amount of fluid in your mouth before being asked to swallow while the imaging is performed .In some instances you may be asked to swallow barium coated marshmallows to assess the motility of a solid food bolus. The exam can be recorded as a digital or video fluoroscopy procedure. POST PROCEDURE It will take 1-2 days for the barium to pass through your system. To facilitate this, it is important, unless otherwise directed, to increase your fluids for the next 24-48hrs and to resume your normal diet.  This test typically takes about 30 minutes to  perform. __________________________________________________________________________________

## 2014-07-11 ENCOUNTER — Ambulatory Visit (HOSPITAL_COMMUNITY)
Admission: RE | Admit: 2014-07-11 | Discharge: 2014-07-11 | Disposition: A | Discharge status: Home / Self Care | Payer: Commercial Managed Care - HMO | Source: Ambulatory Visit | Attending: Gastroenterology | Admitting: Gastroenterology

## 2014-07-11 DIAGNOSIS — R1310 Dysphagia, unspecified: Secondary | ICD-10-CM | POA: Diagnosis not present

## 2014-07-19 ENCOUNTER — Other Ambulatory Visit: Payer: Self-pay | Admitting: *Deleted

## 2014-07-19 MED ORDER — OXYCODONE-ACETAMINOPHEN 5-325 MG PO TABS: ORAL_TABLET | ORAL | Status: DC

## 2014-07-19 NOTE — Telephone Encounter (Signed)
[facility] 

## 2014-07-21 ENCOUNTER — Other Ambulatory Visit: Payer: Self-pay | Admitting: *Deleted

## 2014-07-21 MED ORDER — ALPRAZOLAM 1 MG PO TABS: ORAL_TABLET | ORAL | Status: DC

## 2014-07-21 NOTE — Telephone Encounter (Signed)
[facility] 

## 2014-07-22 ENCOUNTER — Other Ambulatory Visit (HOSPITAL_COMMUNITY): Payer: Self-pay | Admitting: Gastroenterology

## 2014-07-22 ENCOUNTER — Telehealth: Payer: Self-pay | Admitting: *Deleted

## 2014-07-22 DIAGNOSIS — K224 Dyskinesia of esophagus: Secondary | ICD-10-CM

## 2014-07-22 DIAGNOSIS — K228 Other specified diseases of esophagus: Secondary | ICD-10-CM

## 2014-07-22 DIAGNOSIS — K2289 Other specified disease of esophagus: Secondary | ICD-10-CM

## 2014-07-22 NOTE — Telephone Encounter (Signed)
L/M FOR PATIENT THAT MODIFIED BARIUM SWALLOW IS SCHEDULED ON 07/29/2014 AT 1PM AT [facility]  [facility] AT 12:45PM   NO PREP

## 2014-07-29 ENCOUNTER — Ambulatory Visit (HOSPITAL_COMMUNITY)
Admission: RE | Admit: 2014-07-29 | Discharge: 2014-07-29 | Disposition: A | Discharge status: Home / Self Care | Payer: Commercial Managed Care - HMO | Source: Ambulatory Visit | Attending: Gastroenterology | Admitting: Gastroenterology

## 2014-07-29 DIAGNOSIS — K228 Other specified diseases of esophagus: Secondary | ICD-10-CM

## 2014-07-29 DIAGNOSIS — K2289 Other specified disease of esophagus: Secondary | ICD-10-CM

## 2014-07-29 DIAGNOSIS — K224 Dyskinesia of esophagus: Secondary | ICD-10-CM

## 2014-08-01 NOTE — Progress Notes (Signed)
Quick Note:  As per speech pathology patient needs to be placed on a dysphagia 3 diet. Please inform the patient's daughter and instruct her accordingly. ______

## 2014-08-03 ENCOUNTER — Emergency Department (HOSPITAL_COMMUNITY)
Admission: EM | Admit: 2014-08-03 | Discharge: 2014-08-04 | Disposition: A | Discharge status: Home / Self Care | Payer: Commercial Managed Care - HMO | Attending: Emergency Medicine | Admitting: Emergency Medicine

## 2014-08-03 ENCOUNTER — Encounter (HOSPITAL_COMMUNITY): Payer: Self-pay | Admitting: Emergency Medicine

## 2014-08-03 DIAGNOSIS — M79601 Pain in right arm: Secondary | ICD-10-CM | POA: Diagnosis not present

## 2014-08-03 DIAGNOSIS — N183 Chronic kidney disease, stage 3 (moderate): Secondary | ICD-10-CM | POA: Insufficient documentation

## 2014-08-03 DIAGNOSIS — D649 Anemia, unspecified: Secondary | ICD-10-CM | POA: Diagnosis not present

## 2014-08-03 DIAGNOSIS — I5023 Acute on chronic systolic (congestive) heart failure: Secondary | ICD-10-CM | POA: Diagnosis not present

## 2014-08-03 DIAGNOSIS — E860 Dehydration: Secondary | ICD-10-CM | POA: Insufficient documentation

## 2014-08-03 DIAGNOSIS — Z87891 Personal history of nicotine dependence: Secondary | ICD-10-CM | POA: Diagnosis not present

## 2014-08-03 DIAGNOSIS — I959 Hypotension, unspecified: Secondary | ICD-10-CM | POA: Insufficient documentation

## 2014-08-03 DIAGNOSIS — Z8659 Personal history of other mental and behavioral disorders: Secondary | ICD-10-CM | POA: Insufficient documentation

## 2014-08-03 DIAGNOSIS — I509 Heart failure, unspecified: Secondary | ICD-10-CM | POA: Insufficient documentation

## 2014-08-03 DIAGNOSIS — Z880 Allergy status to penicillin: Secondary | ICD-10-CM | POA: Insufficient documentation

## 2014-08-03 DIAGNOSIS — Z79899 Other long term (current) drug therapy: Secondary | ICD-10-CM | POA: Diagnosis not present

## 2014-08-03 DIAGNOSIS — M79604 Pain in right leg: Secondary | ICD-10-CM | POA: Diagnosis not present

## 2014-08-03 DIAGNOSIS — R4182 Altered mental status, unspecified: Secondary | ICD-10-CM | POA: Diagnosis present

## 2014-08-03 DIAGNOSIS — R404 Transient alteration of awareness: Secondary | ICD-10-CM | POA: Insufficient documentation

## 2014-08-03 DIAGNOSIS — M79605 Pain in left leg: Secondary | ICD-10-CM | POA: Diagnosis not present

## 2014-08-03 DIAGNOSIS — Z8709 Personal history of other diseases of the respiratory system: Secondary | ICD-10-CM | POA: Diagnosis not present

## 2014-08-03 DIAGNOSIS — E039 Hypothyroidism, unspecified: Secondary | ICD-10-CM | POA: Diagnosis not present

## 2014-08-03 DIAGNOSIS — M79602 Pain in left arm: Secondary | ICD-10-CM | POA: Insufficient documentation

## 2014-08-03 DIAGNOSIS — I129 Hypertensive chronic kidney disease with stage 1 through stage 4 chronic kidney disease, or unspecified chronic kidney disease: Secondary | ICD-10-CM | POA: Insufficient documentation

## 2014-08-03 DIAGNOSIS — R011 Cardiac murmur, unspecified: Secondary | ICD-10-CM | POA: Insufficient documentation

## 2014-08-03 LAB — URINALYSIS, ROUTINE W REFLEX MICROSCOPIC
Bilirubin Urine: NEGATIVE
GLUCOSE, UA: NEGATIVE mg/dL
Hgb urine dipstick: NEGATIVE
KETONES UR: NEGATIVE mg/dL
Leukocytes, UA: NEGATIVE
Nitrite: NEGATIVE
PROTEIN: NEGATIVE mg/dL
Specific Gravity, Urine: 1.009 (ref 1.005–1.030)
UROBILINOGEN UA: 0.2 mg/dL (ref 0.0–1.0)
pH: 6 (ref 5.0–8.0)

## 2014-08-03 LAB — BASIC METABOLIC PANEL
Anion gap: 10 (ref 5–15)
BUN: 27 mg/dL — ABNORMAL HIGH (ref 6–20)
CHLORIDE: 98 mmol/L — AB (ref 101–111)
CO2: 31 mmol/L (ref 22–32)
Calcium: 9.5 mg/dL (ref 8.9–10.3)
Creatinine, Ser: 1.15 mg/dL — ABNORMAL HIGH (ref 0.44–1.00)
GFR calc Af Amer: 46 mL/min — ABNORMAL LOW (ref >60)
GFR calc non Af Amer: 40 mL/min — ABNORMAL LOW (ref >60)
GLUCOSE: 98 mg/dL (ref 65–99)
Potassium: 3.6 mmol/L (ref 3.5–5.1)
SODIUM: 139 mmol/L (ref 135–145)

## 2014-08-03 LAB — CBC WITH DIFFERENTIAL/PLATELET
Basophils Absolute: 0 10*3/uL (ref 0.0–0.1)
Basophils Relative: 0 % (ref 0–1)
EOS ABS: 0.1 10*3/uL (ref 0.0–0.7)
EOS PCT: 2 % (ref 0–5)
HEMATOCRIT: 33.8 % — AB (ref 36.0–46.0)
Hemoglobin: 11.1 g/dL — ABNORMAL LOW (ref 12.0–15.0)
LYMPHS ABS: 1 10*3/uL (ref 0.7–4.0)
Lymphocytes Relative: 22 % (ref 12–46)
MCH: 29.4 pg (ref 26.0–34.0)
MCHC: 32.8 g/dL (ref 30.0–36.0)
MCV: 89.7 fL (ref 78.0–100.0)
MONO ABS: 0.3 10*3/uL (ref 0.1–1.0)
Monocytes Relative: 8 % (ref 3–12)
Neutro Abs: 2.9 10*3/uL (ref 1.7–7.7)
Neutrophils Relative %: 68 % (ref 43–77)
Platelets: 123 10*3/uL — ABNORMAL LOW (ref 150–400)
RBC: 3.77 MIL/uL — AB (ref 3.87–5.11)
RDW: 13.1 % (ref 11.5–15.5)
WBC: 4.3 10*3/uL (ref 4.0–10.5)

## 2014-08-03 MED ORDER — SODIUM CHLORIDE 0.9 % IV BOLUS (SEPSIS)
500.0000 mL | Freq: Once | INTRAVENOUS | Status: AC
  Administered 2014-08-03: 500 mL via INTRAVENOUS

## 2014-08-03 MED ORDER — SODIUM CHLORIDE 0.9 % IV SOLN
INTRAVENOUS | Status: DC
  Administered 2014-08-03: 21:00:00 via INTRAVENOUS

## 2014-08-03 NOTE — ED Provider Notes (Signed)
CSN: [id number]     Arrival date & time 08/03/14  1853 History   First MD Initiated Contact with Patient 08/03/14 1953     Chief Complaint  Patient presents with  . Loss of Consciousness     (Consider location/radiation/quality/duration/timing/severity/associated sxs/prior Treatment) HPI  Isabel Chaney is a 81 y.o. female who presents for evaluation of altered mental status. She lives in a nursing care home, and was found unresponsive, was sitting in a wheelchair. She had been in a wheelchair for about 30 minutes. EMS arrived, later down on a stretcher, and she began to arouse. She then aroused to verbal stimuli low back more. She was found to be hypotensive and was treated with IV fluid bolus with improvement of her blood pressure. There are no other known recent problems.  According to the patient's son, who came to the emergency department. She has somewhat more sedated and usual. He feels that the medications that she is receiving at the nursing home are making her sleepy. She is on a fentanyl patch, for generalized pain. She is on chronic benzodiazepine therapy, for anxiety.  Level V Caveat-confusion     Past Medical History  Diagnosis Date  . Hypothyroidism   . Anemia   . Anxiety   . Depression   . Acute on chronic systolic heart failure   . Osteoporosis   . CHF (congestive heart failure)   . Chronic kidney disease, stage III (moderate)   . Hypertension   . Memory disorder 06/14/2014  . Abnormality of gait 06/14/2014  . Allergic rhinitis    Past Surgical History  Procedure Laterality Date  . Cataract extraction Bilateral   . Abdominal hysterectomy     Family History  Problem Relation Age of Onset  . Heart attack Mother   . Hypertension Mother   . Heart attack Father   . Hypertension Father   . Heart disease Sister   . Heart disease Brother   . Heart attack Brother   . Heart disease Sister   . Dementia Sister   . Heart disease Brother   . Heart attack Brother    . Stroke Brother    History  Substance Use Topics  . Smoking status: Former Smoker    Quit date: 03/18/1964  . Smokeless tobacco: Never Used  . Alcohol Use: No   OB History    No data available     Review of Systems  All other systems reviewed and are negative.     Allergies  Penicillins  Home Medications   Prior to Admission medications   Medication Sig Start Date End Date Taking? Authorizing Provider  ALPRAZolam Prudy Feeler) 1 MG tablet Take one tablet by mouth twice daily for anxiety 07/21/14   Tiffany Chaney Reed, DO  carvedilol (COREG) 3.125 MG tablet Take 3.125 mg by mouth 2 (two) times daily with a meal.    Historical Provider, MD  Cholecalciferol 4000 UNITS CAPS Take by mouth. Take 2 casapsule =4000 units by mouth daily for supplement    Historical Provider, MD  Cranberry 450 MG CAPS Take 1 tablet by mouth daily.    Historical Provider, MD  fentaNYL (DURAGESIC - DOSED MCG/HR) 12 MCG/HR Apply one patch topically every 48 hours for pain. Rotate sites. Check placement of patch every shift. 05/16/14   Tiffany Chaney Reed, DO  ferrous sulfate 325 (65 FE) MG tablet Take 325 mg by mouth daily with breakfast.    Historical Provider, MD  fexofenadine (ALLEGRA) 180 MG tablet Take  180 mg by mouth daily. For allergies    Historical Provider, MD  hydrALAZINE (APRESOLINE) 10 MG tablet Take 10 mg by mouth 2 (two) times daily.    Historical Provider, MD  levothyroxine (SYNTHROID, LEVOTHROID) 50 MCG tablet Take 50 mcg by mouth daily before breakfast. For Hypothyroid, check pulse weekly    Historical Provider, MD  Menthol, Topical Analgesic, (BIOFREEZE) 4 % GEL Apply topically 2 (two) times daily as needed. For bilateral extremity joints.    Historical Provider, MD  oxyCODONE-acetaminophen (PERCOCET/ROXICET) 5-325 MG per tablet Take one tablet by mouth twice daily for pain. Do not exceed 4gm of Tylenol in 24 hours 07/19/14   Kimber RelicArthur G Green, MD  polyethylene glycol [facility](MIRALAX / Ethelene HalGLYCOLAX) packet Take 17 g by  mouth daily. 4-8 oz of liquid daily for constipation *HOLD for diarrhea*    Historical Provider, MD  polyvinyl alcohol (LIQUIFILM TEARS) 1.4 % ophthalmic solution Place 1 drop into both eyes 2 (two) times daily.    Historical Provider, MD  risperiDONE (RISPERDAL) 0.5 MG tablet Take 0.5 mg by mouth 2 (two) times daily.    Historical Provider, MD  sertraline (ZOLOFT) 50 MG tablet Take 50 mg by mouth daily. For depression    Historical Provider, MD  tiZANidine (ZANAFLEX) 2 MG tablet Take 2 mg by mouth. Take 1 tablet by mouth daily at 6 pm for spasm    Historical Provider, MD  torsemide (DEMADEX) 10 MG tablet Take 10 mg by mouth daily. Po QD- HTN    Historical Provider, MD  vitamin B-12 (CYANOCOBALAMIN) 1000 MCG tablet Take 1,000 mcg by mouth daily. For B-12 supplement    Historical Provider, MD   BP 174/63 mmHg  Pulse 51  Temp(Src) 98 F (36.7 C) (Oral)  Resp 17  SpO2 94% Physical Exam  Constitutional: She appears well-developed. She appears distressed (She is uncomfortable).  Elderly, frail  HENT:  Head: Normocephalic and atraumatic.  Right Ear: External ear normal.  Left Ear: External ear normal.  Eyes: Conjunctivae and EOM are normal. Pupils are equal, round, and reactive to light.  Neck: Normal range of motion and phonation normal. Neck supple.  Cardiovascular: Normal rate and regular rhythm.   Soft cardiac murmur is present  Pulmonary/Chest: Effort normal and breath sounds normal. She exhibits no bony tenderness.  Abdominal: Soft. There is no tenderness.  Musculoskeletal:  Mild generalized pain; arms and legs bilaterally, without deformities.  Neurological: She is alert. No cranial nerve deficit or sensory deficit. She exhibits normal muscle tone. Coordination normal.  Skin: Skin is warm, dry and intact.  Psychiatric:  She is tearful  Nursing note and vitals reviewed.   ED Course  Procedures (including critical care time)  Medications  0.9 %  sodium chloride infusion (  Intravenous New Bag/Given 08/03/14 2058)  sodium chloride 0.9 % bolus 500 mL (500 mLs Intravenous New Bag/Given 08/03/14 2058)    Patient Vitals for the past 24 hrs:  BP Temp Temp src Pulse Resp SpO2  08/03/14 2045 179/83 mmHg - - (!) 54 14 94 %  08/03/14 2030 168/73 mmHg - - (!) 55 18 94 %  08/03/14 2015 183/65 mmHg - - (!) 55 16 95 %  08/03/14 2000 (!) 151/47 mmHg - - (!) 53 16 93 %  08/03/14 1945 140/61 mmHg - - (!) 49 17 93 %  08/03/14 1932 174/63 mmHg - - (!) 51 17 94 %  08/03/14 1907 156/59 mmHg 98 F (36.7 C) Oral (!) 47 - 96 %  08/03/14 1853 - - - - - 100 %    10:34 PM Reevaluation with update and discussion. After initial assessment and treatment, an updated evaluation reveals patient starts here now, and we discussed findings and treatment recommendations. We reviewed the records from Dr. Anne HahnWillis, who saw her 06/14/2014 and made recommendations for simplifying her medicine list. Daughter understands that the patient will be going back to her facility, tonight. Isabel Chaney    Labs Review Labs Reviewed - No data to display  Imaging Review No results found.   EKG Interpretation None        Date: 19:32- MUSE hyperlink is inactive  Rate: 54  Rhythm: normal sinus rhythm  QRS Axis: normal  PR and QT Intervals: PR prolonged  ST/T Wave abnormalities: normal  PR and QRS Conduction Disutrbances:none  Narrative Interpretation:   Old EKG Reviewed: none available     MDM   Final diagnoses:  Transient hypotension  Dehydration  Transient alteration of awareness    Transient altered mild status associated with transient  hypotension. Patient is on multiple sedating medications. Doubt serous bacterial infection, metabolic instability or impending vascular collapse.  Nursing Notes Reviewed/ Care Coordinated Applicable Imaging Reviewed Interpretation of Laboratory Data incorporated into ED treatment  The patient appears reasonably screened and/or stabilized for  discharge and I doubt any other medical condition or other [facility] requiring further screening, evaluation, or treatment in the ED at this time prior to discharge.  Plan: Home Medications- Stop Percocet and Zanaflex; Home Treatments- Push oral fluids; return here if the recommended treatment, does not improve the symptoms; Recommended follow up- PCP f/u asap    Mancel BaleElliott Chantae Soo, MD 08/04/14 [phone]

## 2014-08-03 NOTE — ED Notes (Signed)
Per EMS - [facility] due to syncopal episode at dinner table. Pt pressure on EMS arrival 70/50, unresponsive. CBG 173. Pt when moved to stretcher, pressure up to 123/56, pt responsive. Received 250 NS bolus. 4L O2 nasal cannula 100%, does not normally wear oxygen. Pt has fentanyl patch on left upper chest. Pt has slurred speech, apparently this is her baseline. Alert to self and place, this is normal for her.

## 2014-08-04 LAB — URINE CULTURE
Colony Count: >=100000
Special Requests: NORMAL

## 2014-08-04 NOTE — Discharge Instructions (Signed)
Stop taking the Zanaflex and Percocet. Try to drink 2 or 3 extra glasses of water each day. Follow-up with her doctor for checkup as soon as possible.     Dehydration, Adult Dehydration means your body does not have as much fluid as it needs. Your kidneys, brain, and heart will not work properly without the right amount of fluids and salt.  HOME CARE  Ask your doctor how to replace body fluid losses (rehydrate).  Drink enough fluids to keep your pee (urine) clear or pale yellow.  Drink small amounts of fluids often if you feel sick to your stomach (nauseous) or throw up (vomit).  Eat like you normally do.  Avoid:  Foods or drinks high in sugar.  Bubbly (carbonated) drinks.  Juice.  Very hot or cold fluids.  Drinks with caffeine.  Fatty, greasy foods.  Alcohol.  Tobacco.  Eating too much.  Gelatin desserts.  Wash your hands to avoid spreading germs (bacteria, viruses).  Only take medicine as told by your doctor.  Keep all doctor visits as told. GET HELP RIGHT AWAY IF:   You cannot drink something without throwing up.  You get worse even with treatment.  Your vomit has blood in it or looks greenish.  Your poop (stool) has blood in it or looks black and tarry.  You have not peed in 6 to 8 hours.  You pee a small amount of very dark pee.  You have a fever.  You pass out (faint).  You have belly (abdominal) pain that gets worse or stays in one spot (localizes).  You have a rash, stiff neck, or bad headache.  You get easily annoyed, sleepy, or are hard to wake up.  You feel weak, dizzy, or very thirsty. MAKE SURE YOU:   Understand these instructions.  Will watch your condition.  Will get help right away if you are not doing well or get worse. Document Released: 12/29/2008 Document Revised: 05/27/2011 Document Reviewed: 10/22/2010 [facility] Patient Information 2015 [place], [place]. This information is not intended to replace advice given to you  by your health care provider. Make sure you discuss any questions you have with your health care provider.  Hypotension As your heart beats, it forces blood through your body. This force is called blood pressure. If you have hypotension, you have low blood pressure. When your blood pressure is too low, you may not get enough blood to your brain. You may feel weak, feel lightheaded, have a fast heartbeat, or even pass out (faint). HOME CARE  Drink enough fluids to keep your pee (urine) clear or pale yellow.  Take all medicines as told by your doctor.  Get up slowly after sitting or lying down.  Wear support stockings as told by your doctor.  Maintain a healthy diet by including foods such as fruits, vegetables, nuts, whole grains, and lean meats. GET HELP IF:  You are throwing up (vomiting) or have watery poop (diarrhea).  You have a fever for more than 2-3 days.  You feel more thirsty than usual.  You feel weak and tired. GET HELP RIGHT AWAY IF:   You pass out (faint).  You have chest pain or a fast or irregular heartbeat.  You lose feeling in part of your body.  You cannot move your arms or legs.  You have trouble speaking.  You get sweaty or feel lightheaded. MAKE SURE YOU:   Understand these instructions.  Will watch your condition.  Will get help right away if  you are not doing well or get worse. Document Released: 05/29/2009 Document Revised: 11/04/2012 Document Reviewed: 09/04/2012 [facility] Patient Information 2015 [place], [place]. This information is not intended to replace advice given to you by your health care provider. Make sure you discuss any questions you have with your health care provider.

## 2014-08-09 ENCOUNTER — Encounter: Payer: Self-pay | Admitting: Adult Health

## 2014-08-09 ENCOUNTER — Non-Acute Institutional Stay (SKILLED_NURSING_FACILITY): Payer: Commercial Managed Care - HMO | Admitting: Adult Health

## 2014-08-09 DIAGNOSIS — N183 Chronic kidney disease, stage 3 unspecified: Secondary | ICD-10-CM

## 2014-08-09 DIAGNOSIS — D509 Iron deficiency anemia, unspecified: Secondary | ICD-10-CM | POA: Diagnosis not present

## 2014-08-09 DIAGNOSIS — I5022 Chronic systolic (congestive) heart failure: Secondary | ICD-10-CM | POA: Diagnosis not present

## 2014-08-09 DIAGNOSIS — F29 Unspecified psychosis not due to a substance or known physiological condition: Secondary | ICD-10-CM

## 2014-08-09 DIAGNOSIS — K5900 Constipation, unspecified: Secondary | ICD-10-CM

## 2014-08-09 DIAGNOSIS — G8929 Other chronic pain: Secondary | ICD-10-CM | POA: Diagnosis not present

## 2014-08-09 DIAGNOSIS — F0630 Mood disorder due to known physiological condition, unspecified: Secondary | ICD-10-CM | POA: Diagnosis not present

## 2014-08-09 DIAGNOSIS — F329 Major depressive disorder, single episode, unspecified: Secondary | ICD-10-CM | POA: Diagnosis not present

## 2014-08-09 DIAGNOSIS — F32A Depression, unspecified: Secondary | ICD-10-CM

## 2014-08-09 DIAGNOSIS — I10 Essential (primary) hypertension: Secondary | ICD-10-CM | POA: Diagnosis not present

## 2014-08-09 DIAGNOSIS — F419 Anxiety disorder, unspecified: Secondary | ICD-10-CM

## 2014-08-09 DIAGNOSIS — E039 Hypothyroidism, unspecified: Secondary | ICD-10-CM | POA: Diagnosis not present

## 2014-08-09 DIAGNOSIS — J309 Allergic rhinitis, unspecified: Secondary | ICD-10-CM

## 2014-08-09 NOTE — Progress Notes (Signed)
Patient ID: Isabel Chaney, female   DOB: 10/08/1919, 81 y.o.   MRN: [id number]   08/09/2014  Facility:  Nursing Home Location:  [facility] Room Number: [id number] LEVEL OF CARE:  SNF (31)   Chief Complaint  Patient presents with  . Medical Management of Chronic Issues    CHF, mood disorder, constipation, hypothyroidism, allergic rhinitis, anemia, depression, anxiety, hypertension, chronic pain and psychosis    HISTORY OF PRESENT ILLNESS:  This is a 81 year old female who is a long-term care resident of [facility]. She was recently transferred to the hospital for change in LOC. Isabel Chaney and Isabel Chaney were discontinued. Patient is seen today and is verbally responsive and has stable mood. Neurology has recommended to decrease patient's Isabel Chaney. She was recently treated for Pneumonia with Levaquin. No SOB nor cough noted.   PAST MEDICAL HISTORY:  Past Medical History  Diagnosis Date  . Hypothyroidism   . Anemia   . Anxiety   . Depression   . Acute on chronic systolic heart failure   . Osteoporosis   . CHF (congestive heart failure)   . Chronic kidney disease, stage III (moderate)   . Hypertension   . Memory disorder 06/14/2014  . Abnormality of gait 06/14/2014  . Allergic rhinitis     CURRENT MEDICATIONS: Reviewed per MAR/see medication list  Allergies  Allergen Reactions  . Penicillins     Per MAR     REVIEW OF SYSTEMS:  GENERAL: no change in appetite, no fatigue, no weight changes, no fever, chills or weakness RESPIRATORY: no cough, SOB, DOE, wheezing, hemoptysis CARDIAC: no chest pain, edema or palpitations GI: no abdominal pain, diarrhea, constipation, heart burn, nausea or vomiting  PHYSICAL EXAMINATION  GENERAL: no acute distress, normal body habitus EYES: conjunctivae normal, sclerae normal, normal eye lids NECK: supple, trachea midline, no neck masses, no thyroid tenderness, no thyromegaly LYMPHATICS: no LAN in the neck, no  supraclavicular LAN RESPIRATORY: breathing is even & unlabored, BS CTAB CARDIAC: RRR, no murmur,no extra heart sounds, BLE edema 1+ GI: abdomen soft, normal BS, no masses, no tenderness, no hepatomegaly, no splenomegaly EXTREMITIES:  Able to move X 4 extremities; has generalized weakness on BLE; uses wheelchair to move around PSYCHIATRIC: the patient is alert & oriented to person, affect & behavior appropriate  LABS/RADIOLOGY: Labs reviewed: Basic Metabolic Panel:  Recent Labs  72/53/6602/29/16 08/03/14 2054  NA 141 139  K 3.7 3.6  CL  --  98*  CO2  --  31  GLUCOSE  --  98  BUN 33* 27*  CREATININE 1.1 1.15*  CALCIUM  --  9.5   CBC:  Recent Labs  09/20/13 08/03/14 2054  WBC 3.2 4.3  NEUTROABS  --  2.9  HGB 10.3* 11.1*  HCT 32* 33.8*  MCV  --  89.7  PLT 167 123*     Dg Op Swallowing Func-medicare/speech Path  07/29/2014   CLINICAL DATA:  Recent pneumonia.  Presbyesophagus.  EXAM: MODIFIED BARIUM SWALLOW  TECHNIQUE: Different consistencies of barium were administered orally to the patient by the Speech Pathologist. Imaging of the pharynx was performed in the lateral projection.  FLUOROSCOPY TIME:  Fluoroscopy Time:  2 minutes 18 seconds  Number of Acquired Images:  1  COMPARISON:  Esophagram dated 07/11/2014  FINDINGS: Thin liquid- recurrent asymptomatic flash laryngeal penetration and aspiration.  Nectar thick liquid- within normal limits  Pure- within normal limits  Barium tablet -  within normal limits  IMPRESSION: Recurrent asymptomatic flash  laryngeal penetration and aspiration with thin liquid.  Please refer to the Speech Pathologists report for complete details and recommendations.   Electronically Signed   By: Isabel Chaney M.D.   On: 07/29/2014 14:21     Objective Swallowing Evaluation:    Patient Details  Name: Isabel Chaney MRN: [id number] Date of Birth: 1919-10-10  Today's Date: 07/29/2014 Time: SLP Start Time (ACUTE ONLY): 1300-SLP Stop Time (ACUTE ONLY): 1340 SLP Time  Calculation (min) (ACUTE ONLY): 40 min  Past Medical History:  Past Medical History  Diagnosis Date  . Hypothyroidism   . Anemia   . Anxiety   . Depression   . Acute on chronic systolic heart failure   . Osteoporosis   . CHF (congestive heart failure)   . Chronic kidney disease, stage III (moderate)   . Hypertension   . Memory disorder 06/14/2014  . Abnormality of gait 06/14/2014  . Allergic rhinitis    Past Surgical History:  Past Surgical History  Procedure Laterality Date  . Cataract extraction Bilateral   . Abdominal hysterectomy     HPI:  Other Pertinent Information: 81 yo female for OP MBS from SNF due to  frequent coughing during meals, and recent pneumonia.  Pt recently had an  Esophagram which revealed esophageal dysmotility due to presbyesophagus.   Daughter reports pt is recovering from pneumonia currently.  No Data Recorded  Assessment / Plan / Recommendation CHL IP CLINICAL IMPRESSIONS 07/29/2014  Therapy Diagnosis Mild oral phase dysphagia;Suspected primary esophageal  dysphagia;Mild pharyngeal phase dysphagia  Clinical Impression Pt exhibits a suspected primary esophageal dysphagia,  with mild oral and pharyngeal deficits noted.  Pt did have silent  aspiration (trace amount) intermittently, which is likely occuring more  and with a larger volume of aspiration over the course of a meal, as  frequent coughing/choking is noted by the pt and her daughter.  Recommend  diet be modified to Dys 3 (mechanical soft) with nectar thick liquids,  with aspiration and reflux precautions.  Pt should not be advanced to thin  liquids at bedside, unless pt and family agree to accept the risks of  aspiration, as pt silently aspirated on this study x3.      CHL IP TREATMENT RECOMMENDATION 07/29/2014  Treatment Recommendations (No Data)     CHL IP DIET RECOMMENDATION 07/29/2014  SLP Diet Recommendations Dysphagia 3 (Mech soft);Nectar  Liquid Administration via (None)  Medication Administration Whole meds with puree   Compensations Slow rate;Small sips/bites;Follow solids with liquid  Postural Changes and/or Swallow Maneuvers (None)     CHL IP OTHER RECOMMENDATIONS 07/29/2014  Recommended Consults (None)  Oral Care Recommendations Oral care BID  Other Recommendations Order thickener from pharmacy;Prohibited food  (jello, ice cream, thin soups);Clarify dietary restrictions     No flowsheet data found.   No flowsheet data found.   Pertinent Vitals/Pain n/a    SLP Swallow Goals No flowsheet data found.  No flowsheet data found.    CHL IP REASON FOR REFERRAL 07/29/2014  Reason for Referral Objectively evaluate swallowing function     CHL IP ORAL PHASE 07/29/2014  Lips (None)  Tongue (None)  Mucous membranes (None)  Nutritional status (None)  Other (None)  Oxygen therapy (None)  Oral Phase Impaired  Oral - Pudding Teaspoon (None)  Oral - Pudding Cup (None)  Oral - Honey Teaspoon (None)  Oral - Honey Cup (None)  Oral - Honey Syringe (None)  Oral - Nectar Teaspoon (None)  Oral - Nectar Cup (None)  Oral -  Nectar Straw (None)  Oral - Nectar Syringe (None)  Oral - Ice Chips (None)  Oral - Thin Teaspoon (None)  Oral - Thin Cup (None)  Oral - Thin Straw (None)  Oral - Thin Syringe (None)  Oral - Puree (None)  Oral - Mechanical Soft (None)  Oral - Regular (None)  Oral - Multi-consistency (None)  Oral - Pill (None)  Oral Phase - Comment (None)      CHL IP PHARYNGEAL PHASE 07/29/2014  Pharyngeal Phase Impaired  Pharyngeal - Pudding Teaspoon (None)  Penetration/Aspiration details (pudding teaspoon) (None)  Pharyngeal - Pudding Cup (None)  Penetration/Aspiration details (pudding cup) (None)  Pharyngeal - Honey Teaspoon (None)  Penetration/Aspiration details (honey teaspoon) (None)  Pharyngeal - Honey Cup (None)  Penetration/Aspiration details (honey cup) (None)  Pharyngeal - Honey Syringe (None)  Penetration/Aspiration details (honey syringe) (None)  Pharyngeal - Nectar Teaspoon (None)  Penetration/Aspiration details (nectar teaspoon) (None)   Pharyngeal - Nectar Cup (None)  Penetration/Aspiration details (nectar cup) (None)  Pharyngeal - Nectar Straw (None)  Penetration/Aspiration details (nectar straw) (None)  Pharyngeal - Nectar Syringe (None)  Penetration/Aspiration details (nectar syringe) (None)  Pharyngeal - Ice Chips (None)  Penetration/Aspiration details (ice chips) (None)  Pharyngeal - Thin Teaspoon (None)  Penetration/Aspiration details (thin teaspoon) (None)  Pharyngeal - Thin Cup (None)  Penetration/Aspiration details (thin cup) (None)  Pharyngeal - Thin Straw (None)  Penetration/Aspiration details (thin straw) (None)  Pharyngeal - Thin Syringe (None)  Penetration/Aspiration details (thin syringe') (None)  Pharyngeal - Puree (None)  Penetration/Aspiration details (puree) (None)  Pharyngeal - Mechanical Soft (None)  Penetration/Aspiration details (mechanical soft) (None)  Pharyngeal - Regular (None)  Penetration/Aspiration details (regular) (None)  Pharyngeal - Multi-consistency (None)  Penetration/Aspiration details (multi-consistency) (None)  Pharyngeal - Pill (None)  Penetration/Aspiration details (pill) (None)  Pharyngeal Comment (None)      CHL IP CERVICAL ESOPHAGEAL PHASE 07/29/2014  Cervical Esophageal Phase WFL  Pudding Teaspoon (None)  Pudding Cup (None)  Honey Teaspoon (None)  Honey Cup (None)  Honey Straw (None)  Nectar Teaspoon (None)  Nectar Cup (None)  Nectar Straw (None)  Nectar Sippy Cup (None)  Thin Teaspoon (None)  Thin Cup (None)  Thin Straw (None)  Thin Sippy Cup (None)  Cervical Esophageal Comment (None)    CHL IP GO 07/29/2014  Functional Assessment Tool Used Clinical judgement  Functional Limitations Swallowing  Swallow Current Status (Z6109) CJ  Swallow Goal Status (U0454) CJ  Swallow Discharge Status (U9811) CJ  Motor Speech Current Status (B1478) (None)  Motor Speech Goal Status (G9562) (None)  Motor Speech Goal Status (Z3086) (None)  Spoken Language Comprehension Current Status (V7846) (None)  Spoken Language  Comprehension Goal Status (N6295) (None)  Spoken Language Comprehension Discharge Status (M8413) (None)  Spoken Language Expression Current Status (K4401) (None)  Spoken Language Expression Goal Status (U2725) (None)  Spoken Language Expression Discharge Status (D6644) (None)  Attention Current Status (I3474) (None)  Attention Goal Status (Q5956) (None)  Attention Discharge Status (L8756) (None)  Memory Current Status (E3329) (None)  Memory Goal Status (J1884) (None)  Memory Discharge Status (Z6606) (None)  Voice Current Status (T0160) (None)  Voice Goal Status (F0932) (None)  Voice Discharge Status (T5573) (None)  Other Speech-Language Pathology Functional Limitation (U2025) (None)  Other Speech-Language Pathology Functional Limitation Goal Status (K2706)  (None)  Other Speech-Language Pathology Functional Limitation Discharge Status  [phone]) (None)           Maryjo Rochester T 07/29/2014, 3:02 PM    Dg Esophagus  07/11/2014  CLINICAL DATA:  Dysphagia.  EXAM: ESOPHOGRAM/BARIUM SWALLOW  TECHNIQUE: Single contrast examination was performed using  thin barium.  FLUOROSCOPY TIME:  Radiation Exposure Index (as provided by the fluoroscopic device):  If the device does not provide the exposure index:  Fluoroscopy Time:  1 minutes and 52 seconds  Number of Acquired Images:  None  COMPARISON:  None.  FINDINGS: Limited exam was performed with patient in a supine, minimally right posterior oblique position. Difficulty secondary to patient immobility and inability to follow directions. Focused single-contrast exam demonstrates probable esophageal dysmotility, suboptimally evaluated. Incomplete primary peristaltic wave with suggestion of tertiary contractions in the mid and lower thoracic esophagus. No dominant stricture identified.  IMPRESSION: 1. Focused, moderately degraded exam performed with the patient supine, minimally oblique, as detailed above. 2. No dominant stricture identified within the esophagus, given above  limitations. 3. 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   Electronically Signed   By: Jeronimo Greaves M.D.   On: 07/11/2014 12:51    ASSESSMENT/PLAN:  Chronic systolic CHF - stable; continue Demadex 10 mg 1 tab by mouth daily and Coreg 3.125 mg 1 tab by mouth twice a day Mood disorder - recently started on Lamictal; continue Lamictal 50 mg by mouth daily; being seen by [facility] Constipation - continue MiraLAX 17 g by mouth daily Hypothyroidism - continue Synthroid 50 g 1 tab by mouth daily Allergic rhinitis - stable; discontinue Allegra Anemia - hemoglobin 11.1; continue ferrous sulfate 325 mg 1 tab by mouth daily Depression - continue Zoloft 50 mg 1 tab by mouth daily Anxiety - discontinue Isabel Chaney 0.5 mg by mouth every 12 noon and continue Isabel Chaney 1 mg by mouth twice a day Hypertension - continue hydralazine 10 mg 1 tab by mouth twice a day and Coreg 3.1-25 mg 1 tab by mouth twice a day Chronic pain -  continue Duragesic 125 g/hour 1 patch transdermally every 48 hours Psychosis - continue Risperdal 0.5 mg 1 tab by mouth twice a day Anemia, iron deficiency - hgb 11.1; continue FeSO4 325 mg 1 tab PO Q D   Goals of care:  long-term care   Labs/test ordered:  Bolivar Haw, NP [employer] [phone]

## 2014-09-23 ENCOUNTER — Encounter: Payer: Self-pay | Admitting: Adult Health

## 2014-09-23 ENCOUNTER — Non-Acute Institutional Stay: Payer: Commercial Managed Care - HMO | Admitting: Adult Health

## 2014-09-23 DIAGNOSIS — F29 Unspecified psychosis not due to a substance or known physiological condition: Secondary | ICD-10-CM | POA: Diagnosis not present

## 2014-09-23 DIAGNOSIS — L03116 Cellulitis of left lower limb: Secondary | ICD-10-CM | POA: Diagnosis not present

## 2014-09-23 DIAGNOSIS — G8929 Other chronic pain: Secondary | ICD-10-CM | POA: Diagnosis not present

## 2014-09-23 DIAGNOSIS — E039 Hypothyroidism, unspecified: Secondary | ICD-10-CM | POA: Diagnosis not present

## 2014-09-23 DIAGNOSIS — F329 Major depressive disorder, single episode, unspecified: Secondary | ICD-10-CM | POA: Diagnosis not present

## 2014-09-23 DIAGNOSIS — F419 Anxiety disorder, unspecified: Secondary | ICD-10-CM | POA: Diagnosis not present

## 2014-09-23 DIAGNOSIS — R682 Dry mouth, unspecified: Secondary | ICD-10-CM

## 2014-09-23 DIAGNOSIS — F32A Depression, unspecified: Secondary | ICD-10-CM

## 2014-09-23 DIAGNOSIS — F0630 Mood disorder due to known physiological condition, unspecified: Secondary | ICD-10-CM

## 2014-09-23 DIAGNOSIS — I10 Essential (primary) hypertension: Secondary | ICD-10-CM

## 2014-09-23 DIAGNOSIS — D509 Iron deficiency anemia, unspecified: Secondary | ICD-10-CM

## 2014-09-23 DIAGNOSIS — I5022 Chronic systolic (congestive) heart failure: Secondary | ICD-10-CM | POA: Diagnosis not present

## 2014-09-23 DIAGNOSIS — K5900 Constipation, unspecified: Secondary | ICD-10-CM | POA: Diagnosis not present

## 2014-09-23 NOTE — Progress Notes (Signed)
Patient ID: Isabel Chaney, female   DOB: 03/02/1920, 81 y.o.   MRN: [id number]   09/23/2014  Facility:  Nursing Home Location:  [facility] Room Number: [id number] LEVEL OF CARE:  SNF (31)   Chief Complaint  Patient presents with  . Medical Management of Chronic Issues    Cellulitis of left leg, chronic pain syndrome, hypertension, chronic systolic CHF, mood disorder, depression, anemia, hypothyroidism, constipation, psychosis, anxiety and dry mouth    HISTORY OF PRESENT ILLNESS:  This is a 81 year old female who is a long-term care resident of [facility]. She is being seen for a routine visit. She is currently treated with keflex for her left leg cellulitis. It is now resolving. Patient is now in a different room per her request since she doesn't get along well with roommate. She has moved rooms several times per her request. Her CHF is stable. No SOB. Latest tsh is 1.243 and currently on Synthroid for hypothyroidism. Her mood is stable and currently takes Xanax, Risperdal and Lamictal. She would occasionally be heard complaining of something.  PAST MEDICAL HISTORY:  Past Medical History  Diagnosis Date  . Hypothyroidism   . Anemia   . Anxiety   . Depression   . Acute on chronic systolic heart failure   . Osteoporosis   . CHF (congestive heart failure)   . Chronic kidney disease, stage III (moderate)   . Hypertension   . Memory disorder 06/14/2014  . Abnormality of gait 06/14/2014  . Allergic rhinitis     CURRENT MEDICATIONS: Reviewed per MAR/see medication list  Allergies  Allergen Reactions  . Penicillins     Per MAR     REVIEW OF SYSTEMS:  GENERAL: no change in appetite, no fatigue, no weight changes, no fever, chills or weakness RESPIRATORY: no cough, SOB, DOE, wheezing, hemoptysis CARDIAC: no chest pain, or palpitations GI: no abdominal pain, diarrhea, constipation, heart burn, nausea or vomiting  PHYSICAL EXAMINATION  GENERAL: no acute  distress, normal body habitus SKIN:  Left leg wound is dry, slight redness  EYES: conjunctivae normal, sclerae normal, normal eye lids NECK: supple, trachea midline, no neck masses, no thyroid tenderness, no thyromegaly LYMPHATICS: no LAN in the neck, no supraclavicular LAN RESPIRATORY: breathing is even & unlabored, BS CTAB CARDIAC: RRR, no murmur,no extra heart sounds, BLE edema 2+ GI: abdomen soft, normal BS, no masses, no tenderness, no hepatomegaly, no splenomegaly EXTREMITIES:  Able to move X 4 extremities; has generalized weakness on BLE; uses wheelchair to move around PSYCHIATRIC: the patient is alert & oriented to person, affect & behavior appropriate  LABS/RADIOLOGY: Labs reviewed: 08/26/14  bilateral lower extremity duplex venous ultrasound is negative for DVT 08/18/14  glucose 130 BUN 18 creatinine 0.99 sodium 144 potassium 3.0 CO2 29 calcium 9.0 TIBC 196 serum iron 48 iron saturation 24% uric acid 9.4 ferritin 213 08/10/14  TSH 1.243 Basic Metabolic Panel:  Recent Labs  40/98/1102/29/16 08/03/14 2054  NA 141 139  K 3.7 3.6  CL  --  98*  CO2  --  31  GLUCOSE  --  98  BUN 33* 27*  CREATININE 1.1 1.15*  CALCIUM  --  9.5   CBC:  Recent Labs  08/03/14 2054  WBC 4.3  NEUTROABS 2.9  HGB 11.1*  HCT 33.8*  MCV 89.7  PLT 123*    ASSESSMENT/PLAN:  Cellulitis of left leg - continue Keflex 500 mg 1 tab PO Q 6 hours for a total of 10  days  Chronic systolic CHF - stable; continue Demadex 10 mg 1 tab by mouth 2X/weekly (Mondays and Thursdays) and Coreg 3.125 mg 1 tab by mouth twice a day  Mood disorder - continue Lamictal 50 mg by mouth daily; being seen by [facility]  Constipation - continue MiraLAX 17 g by mouth daily  Hypothyroidism - tsh 1.243; continue Synthroid 50 g 1 tab by mouth daily  Anemia, iron deficiency - hemoglobin 9.9; continue ferrous sulfate 325 mg 1 tab by mouth daily  Depression - mood is stable; continue Zoloft 50 mg 1 tab by mouth  daily  Anxiety - continue Xanax 1 mg by mouth twice a day  Hypertension - continue hydralazine 10 mg 1 tab by mouth TID and Coreg 3.125 mg 1 tab by mouth twice a day  Chronic pain -  continue Duragesic 12g/hour 1 patch transdermally every 48 hours  Psychosis - continue Risperdal 0.5 mg 1 tab by mouth twice a day  Dry mouth - continue Biotene 10 ml swish and spit daily   Goals of care:  long-term care    [facility], NP [facility] [phone]

## 2014-10-18 ENCOUNTER — Ambulatory Visit (INDEPENDENT_AMBULATORY_CARE_PROVIDER_SITE_OTHER): Payer: Commercial Managed Care - HMO | Admitting: Neurology

## 2014-10-18 ENCOUNTER — Encounter: Payer: Self-pay | Admitting: Neurology

## 2014-10-18 VITALS — BP 191/79 | HR 70 | Ht 61.0 in

## 2014-10-18 DIAGNOSIS — R413 Other amnesia: Secondary | ICD-10-CM

## 2014-10-18 DIAGNOSIS — R269 Unspecified abnormalities of gait and mobility: Secondary | ICD-10-CM

## 2014-10-18 NOTE — Progress Notes (Signed)
Reason for visit: memory disturbance  Isabel Chaney is an 81 y.o. female  History of present illness:  Isabel Chaney is a 81 year old right-handed white female with a history of advanced age, and a progressive dementing illness. The patient had a psychosis associated with a urinary tract infection in January 2016. The patient has been noted to have issues with polypharmacy, she was placed on Risperdal, she is on Lamictal, fentanyl, Percocet, alprazolam, and she was on tizanidine. The patient does have some low back pain that she claims is worse when she lies down at night. The patient indicates that her mattress is not comfortable for her. The patient is not ambulatory, she has been noted to have increased swelling in the legs and face. A 2-D echocardiogram was recently done, this reportedly shows severe aortic stenosis, I do not have the results of this study which was done at [facility]. The patient has been relatively stable with her memory issues and confusion since last seen. The patient at times seems to be somewhat sedated in the afternoon or evening hours. She returns to this office for an evaluation.     Past Medical History  Diagnosis Date  . Hypothyroidism   . Anemia   . Anxiety   . Depression   . Acute on chronic systolic heart failure   . Osteoporosis   . CHF (congestive heart failure)   . Chronic kidney disease, stage III (moderate)   . Hypertension   . Memory disorder 06/14/2014  . Abnormality of gait 06/14/2014  . Allergic rhinitis     Past Surgical History  Procedure Laterality Date  . Cataract extraction Bilateral   . Abdominal hysterectomy      Family History  Problem Relation Age of Onset  . Heart attack Mother   . Hypertension Mother   . Heart attack Father   . Hypertension Father   . Heart disease Sister   . Heart disease Brother   . Heart attack Brother   . Heart disease Sister   . Dementia Sister   . Heart disease Brother   . Heart attack Brother   .  Stroke Brother     Social history:  reports that she quit smoking about 50 years ago. She has never used smokeless tobacco. She reports that she does not drink alcohol or use illicit drugs.    Allergies  Allergen Reactions  . Penicillins     Per MAR    Medications:  Prior to Admission medications   Medication Sig Start Date End Date Taking? Authorizing Provider  ALPRAZolam Prudy Feeler) 1 MG tablet Take one tablet by mouth twice daily for anxiety 07/21/14  Yes Tiffany L Reed, DO  carvedilol (COREG) 3.125 MG tablet Take 3.125 mg by mouth 2 (two) times daily with a meal.   Yes Historical Provider, MD  Cholecalciferol 4000 UNITS CAPS Take by mouth. Take 2 casapsule =4000 units by mouth daily for supplement   Yes Historical Provider, MD  Cranberry 450 MG CAPS Take 1 tablet by mouth daily.   Yes Historical Provider, MD  fentaNYL (DURAGESIC - DOSED MCG/HR) 12 MCG/HR Apply one patch topically every 48 hours for pain. Rotate sites. Check placement of patch every shift. 05/16/14  Yes Tiffany L Reed, DO  ferrous sulfate 325 (65 FE) MG tablet Take 325 mg by mouth daily with breakfast.   Yes Historical Provider, MD  guaiFENesin (MUCINEX) 600 MG 12 hr tablet Take 600 mg by mouth 2 (two) times daily.  Yes Historical Provider, MD  hydrALAZINE (APRESOLINE) 10 MG tablet Take 10 mg by mouth 2 (two) times daily.   Yes Historical Provider, MD  lamoTRIgine (LAMICTAL) 100 MG tablet Take 50 mg by mouth daily.   Yes Historical Provider, MD  levothyroxine (SYNTHROID, LEVOTHROID) 50 MCG tablet Take 50 mcg by mouth daily before breakfast. For Hypothyroid, check pulse weekly   Yes Historical Provider, MD  Menthol, Topical Analgesic, (BIOFREEZE) 4 % GEL Apply topically 2 (two) times daily as needed. For bilateral extremity joints.   Yes Historical Provider, MD  polyethylene glycol (MIRALAX / GLYCOLAX) packet Take 17 g by mouth daily. 4-8 oz of liquid daily for constipation *HOLD for diarrhea*   Yes Historical Provider, MD    polyvinyl alcohol (LIQUIFILM TEARS) 1.4 % ophthalmic solution Place 1 drop into both eyes 2 (two) times daily.   Yes Historical Provider, MD  risperiDONE (RISPERDAL) 0.5 MG tablet Take 0.5 mg by mouth 2 (two) times daily.   Yes Historical Provider, MD  sertraline (ZOLOFT) 50 MG tablet Take 50 mg by mouth daily. For depression   Yes Historical Provider, MD  torsemide (DEMADEX) 10 MG tablet Take 10 mg by mouth. 2X/week (Mondays and Thursdays)   Yes Historical Provider, MD  vitamin B-12 (CYANOCOBALAMIN) 1000 MCG tablet Take 1,000 mcg by mouth daily. For B-12 supplement   Yes Historical Provider, MD    ROS:  Out of a complete 14 system review of symptoms, the patient complains only of the following symptoms, and all other reviewed systems are negative.  Memory disorder, confusion Swelling  Blood pressure 191/79, pulse 70, height  (1.549 m).  Physical Exam  General: The patient is alert and cooperative at the time of the examination. The patient is moderately obese.  Skin: No significant peripheral edema is noted. The legs are wrapped bilaterally.   Neurologic Exam  Mental status: The patient is alert and oriented x 1 at the time of the examination (the patient is oriented to name only). Mini-Mental Status Examination done today shows a total score 17/30. The patient is able to name 4 animals in 30 seconds.   Cranial nerves: Facial symmetry is present. Speech is normal, no aphasia or dysarthria is noted. Extraocular movements are full. Visual fields are full.  Motor: The patient has good strength in all 4 extremities. The patient has incomplete abduction of the shoulders and arms bilaterally.  Sensory examination: Soft touch sensation is symmetric on the face and arms.  Coordination: The patient has good finger-nose-finger bilaterally. She is unable to perform heel-to-shin on either side.  Gait and station: The patient is wheelchair-bound, she is nonambulatory.  Reflexes: Deep  tendon reflexes are symmetric, but are depressed.   Assessment/Plan:  1. Progressive memory disorder  2. Polypharmacy  3. Chronic low back pain  4. Nonambulatory state  The patient likely is stable with her mental status at this time. The list of her medications were not provided from the nursing home for this visit. It appears that the patient may be off of the tizanidine. I would recommend discontinuance of the Percocet, using fentanyl only for pain. The patient reports discomfort with lying in the bed at night, a "topper" for the mattress may provide better comfort and reduce back pain. The patient is on Lamictal, I am not clear why, if this is not absolutely necessary, I would recommend discontinuance of this medication. The patient remains on Risperdal, if she is a longer having psychosis and agitation, tapering her off this  medication may be of benefit. The patient will follow-up in 4 months.  Marlan Palau MD 10/18/2014 7:21 PM  [facility] [address] [place], [place] [address]  Phone [phone] Fax [phone]

## 2014-10-18 NOTE — Patient Instructions (Signed)
   Would recommend discontinuance of the Percocet, use fentanyl only for back pain. The patient indicates that she has increased back pain leading down in bed at night, would consider a "topper" for the bed mattress in order to improve comfort at night while sleeping. The patient is on Lamictal, not sure the reason for this, if not absolutely necessary, would discontinue this medication. In the future, a slow taper down on the dose of the alprazolam may improve the problems with confusion.  Will see the patient back in 3-4 months.

## 2014-10-28 ENCOUNTER — Non-Acute Institutional Stay: Payer: Commercial Managed Care - HMO | Admitting: Internal Medicine

## 2014-10-28 DIAGNOSIS — F0391 Unspecified dementia with behavioral disturbance: Secondary | ICD-10-CM | POA: Diagnosis not present

## 2014-10-28 DIAGNOSIS — N3080 Other cystitis without hematuria: Secondary | ICD-10-CM | POA: Diagnosis not present

## 2014-10-28 DIAGNOSIS — E039 Hypothyroidism, unspecified: Secondary | ICD-10-CM | POA: Diagnosis not present

## 2014-10-28 DIAGNOSIS — D509 Iron deficiency anemia, unspecified: Secondary | ICD-10-CM | POA: Diagnosis not present

## 2014-10-28 DIAGNOSIS — I952 Hypotension due to drugs: Secondary | ICD-10-CM | POA: Diagnosis not present

## 2014-10-28 DIAGNOSIS — E876 Hypokalemia: Secondary | ICD-10-CM | POA: Diagnosis not present

## 2014-10-28 DIAGNOSIS — I5022 Chronic systolic (congestive) heart failure: Secondary | ICD-10-CM

## 2014-10-28 DIAGNOSIS — N3090 Cystitis, unspecified without hematuria: Secondary | ICD-10-CM

## 2014-10-28 DIAGNOSIS — F03918 Unspecified dementia, unspecified severity, with other behavioral disturbance: Secondary | ICD-10-CM

## 2014-10-28 NOTE — Progress Notes (Signed)
Patient ID: Isabel Chaney, female   DOB: 06-Jul-1919, 81 y.o.   MRN: [id number]    [facility]  Chief Complaint  Patient presents with  . Medical Management of Chronic Issues   Allergies  Allergen Reactions  . Penicillins     Per MAR   HPI 81 y/o female patient is seen today for routine visit. She completed antibiotics for E.coli uti on 10/13/14. She has been at her baseline and has leg wraps to both her legs. Her breathing is stable. She is on synthroid for her thryoid disorder. She was seen by neurology for her memory issue. Notes reviewed. On review of chart, has low bp reading. Labs reviewed showing low Hb level.   Review of Systems  Constitutional: Negative for fever, chills, diaphoresis.  HENT: Negative for congestion, hearing loss and sore throat.   Respiratory: Negative for cough, sputum production, shortness of breath and wheezing.   Cardiovascular: Negative for chest pain, palpitations Gastrointestinal: Negative for heartburn, nausea, vomiting, abdominal pain. Had bowel movement this am Genitourinary: Negative for dysuria Musculoskeletal: positive for back pain,No falls reported Skin: Negative for itching and rash.  Psychiatric/Behavioral: positive for depression and memory loss.   Past Medical History  Diagnosis Date  . Hypothyroidism   . Anemia   . Anxiety   . Depression   . Acute on chronic systolic heart failure   . Osteoporosis   . CHF (congestive heart failure)   . Chronic kidney disease, stage III (moderate)   . Hypertension   . Memory disorder 06/14/2014  . Abnormality of gait 06/14/2014  . Allergic rhinitis      Medication List       This list is accurate as of: 10/28/14 11:59 PM.  Always use your most recent med list.               acetaminophen 325 MG tablet  Commonly known as:  TYLENOL  Take 650 mg by mouth every 4 (four) hours as needed.     ALPRAZolam 1 MG tablet  Commonly known as:  XANAX  Take one tablet by  mouth twice daily for anxiety     BIOFREEZE 4 % Gel  Generic drug:  Menthol (Topical Analgesic)  Apply topically 2 (two) times daily as needed. For bilateral extremity joints.     carvedilol 3.125 MG tablet  Commonly known as:  COREG  Take 6.25 mg by mouth 2 (two) times daily with a meal.     Cholecalciferol 4000 UNITS Caps  Take by mouth. Take 2 casapsule =4000 units by mouth daily for supplement     Cranberry 450 MG Caps  Take 1 tablet by mouth daily.     ferrous sulfate 325 (65 FE) MG tablet  Take 325 mg by mouth daily with breakfast.     hydrALAZINE 10 MG tablet  Commonly known as:  APRESOLINE  Take 10 mg by mouth 3 (three) times daily.     lamoTRIgine 100 MG tablet  Commonly known as:  LAMICTAL  Take 50 mg by mouth daily.     levothyroxine 50 MCG tablet  Commonly known as:  SYNTHROID, LEVOTHROID  Take 50 mcg by mouth daily before breakfast. For Hypothyroid, check pulse weekly     polyethylene glycol packet  Commonly known as:  MIRALAX / GLYCOLAX  Take 17 g by mouth daily. 4-8 oz of liquid daily for constipation *HOLD for diarrhea*     polyvinyl alcohol 1.4 % ophthalmic solution  Commonly  known as:  LIQUIFILM TEARS  Place 1 drop into both eyes 2 (two) times daily.     potassium chloride 10 MEQ tablet  Commonly known as:  K-DUR,KLOR-CON  Take 10 mEq by mouth every other day.     risperiDONE 0.5 MG tablet  Commonly known as:  RISPERDAL  Take 0.5 mg by mouth 2 (two) times daily.     sertraline 50 MG tablet  Commonly known as:  ZOLOFT  Take 50 mg by mouth daily. For depression     torsemide 10 MG tablet  Commonly known as:  DEMADEX  Take 10 mg by mouth 2 (two) times daily.     vitamin B-12 1000 MCG tablet  Commonly known as:  CYANOCOBALAMIN  Take 1,000 mcg by mouth daily. For B-12 supplement        Physical exam BP 96/58 mmHg  Pulse 62  Temp(Src) 97.2 F (36.2 C)  Resp 18  Ht  (1.575 m)  Wt 153 lb 3.2 oz (69.491 kg)  BMI 28.01 kg/m2  SpO2  98%  Wt Readings from Last 3 Encounters:  10/28/14 153 lb 3.2 oz (69.491 kg)  09/23/14 164 lb 12.8 oz (74.753 kg)  08/09/14 158 lb 14.4 oz (72.077 kg)   General- elderly female in no acute distress Head- atraumatic, normocephalic Eyes- PERRLA, EOMI, no pallor, no icterus Neck- no lymphadenopathy, no jugular vein distension Cardiovascular- normal s1,s2, no murmurs Respiratory- bilateral clear to auscultation, no wheeze, no rhonchi, no crackles Abdomen- bowel sounds present, soft, non tender Musculoskeletal- able to move all 4 extremities, lower extremity weakness, on wheelchair Neurological- no focal deficit, alert and oriented to person Psychiatry- calm this visit   Labs- 08/10/14 tsh 1.243 10/12/14 wbc 5.5, hb 8.9, hct 27.4, plt 162, mcv 86.7, na 143, k 3.5, bun 23, cr 0.83, glu 96, alb 2.4, ca 8.5   Assessment/plan  Hypotension Low bp reading on review. Decrease coreg to 3.125 mg bid. Continue hydralazine with holding parameters. Check bp q shift  Hypokalemia With demadex 10 mg bid, change kcl to 10 meq daily, check bmp in 2 week  Chronic systolic heart failure On hydralazine 10 mg tid and demadex 10 mg bid. Coreg reduced to 3.125 mg bid. Monitor weight  Iron deficiency anemia Change feso4 to 325 mg bid for now, check cbc in 1 month  Recurrent uti Completed antibiotic course in July. Continue cranberry supplement and perineal hygiene  Hypothyroidism Reviewed tsh. Continue current regimen of levothyroxine  Dementia with behavioral disturbance Calm and mood has remained stable per nursing staff. Reviewed neurology note, attempt GDR on risperdal to 0.5 mg qhs for now and reassess. Continue zoloft and xanax. Also on lamictal.Try weaning off lamictal next routine visit   Oneal Grout, MD  [facility] [phone] (Monday-Friday 8 am - 5 pm) [phone] (afterhours)

## 2014-11-14 ENCOUNTER — Ambulatory Visit: Payer: Commercial Managed Care - HMO | Admitting: Cardiovascular Disease

## 2014-11-27 DIAGNOSIS — F0391 Unspecified dementia with behavioral disturbance: Secondary | ICD-10-CM | POA: Insufficient documentation

## 2014-11-27 DIAGNOSIS — N3090 Cystitis, unspecified without hematuria: Secondary | ICD-10-CM | POA: Insufficient documentation

## 2014-11-27 DIAGNOSIS — F03918 Unspecified dementia, unspecified severity, with other behavioral disturbance: Secondary | ICD-10-CM | POA: Insufficient documentation

## 2014-11-27 DIAGNOSIS — D509 Iron deficiency anemia, unspecified: Secondary | ICD-10-CM | POA: Insufficient documentation

## 2014-12-20 ENCOUNTER — Other Ambulatory Visit (HOSPITAL_COMMUNITY): Payer: Self-pay | Admitting: *Deleted

## 2014-12-20 ENCOUNTER — Other Ambulatory Visit (HOSPITAL_COMMUNITY): Payer: Self-pay | Admitting: Internal Medicine

## 2014-12-20 DIAGNOSIS — R1310 Dysphagia, unspecified: Secondary | ICD-10-CM

## 2014-12-22 ENCOUNTER — Ambulatory Visit (HOSPITAL_COMMUNITY)
Admission: RE | Admit: 2014-12-22 | Discharge: 2014-12-22 | Disposition: A | Discharge status: Home / Self Care | Payer: Commercial Managed Care - HMO | Source: Ambulatory Visit | Attending: Internal Medicine | Admitting: Internal Medicine

## 2014-12-22 DIAGNOSIS — R1310 Dysphagia, unspecified: Secondary | ICD-10-CM | POA: Insufficient documentation

## 2014-12-22 DIAGNOSIS — R938 Abnormal findings on diagnostic imaging of other specified body structures: Secondary | ICD-10-CM | POA: Diagnosis not present

## 2014-12-23 ENCOUNTER — Non-Acute Institutional Stay: Payer: Commercial Managed Care - HMO | Admitting: Adult Health

## 2014-12-23 ENCOUNTER — Encounter: Payer: Self-pay | Admitting: Adult Health

## 2014-12-23 DIAGNOSIS — N3080 Other cystitis without hematuria: Secondary | ICD-10-CM | POA: Diagnosis not present

## 2014-12-23 DIAGNOSIS — F0391 Unspecified dementia with behavioral disturbance: Secondary | ICD-10-CM | POA: Diagnosis not present

## 2014-12-23 DIAGNOSIS — G8929 Other chronic pain: Secondary | ICD-10-CM | POA: Diagnosis not present

## 2014-12-23 DIAGNOSIS — D509 Iron deficiency anemia, unspecified: Secondary | ICD-10-CM

## 2014-12-23 DIAGNOSIS — F32A Depression, unspecified: Secondary | ICD-10-CM

## 2014-12-23 DIAGNOSIS — F0630 Mood disorder due to known physiological condition, unspecified: Secondary | ICD-10-CM

## 2014-12-23 DIAGNOSIS — I5022 Chronic systolic (congestive) heart failure: Secondary | ICD-10-CM

## 2014-12-23 DIAGNOSIS — E039 Hypothyroidism, unspecified: Secondary | ICD-10-CM

## 2014-12-23 DIAGNOSIS — I10 Essential (primary) hypertension: Secondary | ICD-10-CM | POA: Diagnosis not present

## 2014-12-23 DIAGNOSIS — F03918 Unspecified dementia, unspecified severity, with other behavioral disturbance: Secondary | ICD-10-CM

## 2014-12-23 DIAGNOSIS — R682 Dry mouth, unspecified: Secondary | ICD-10-CM

## 2014-12-23 DIAGNOSIS — N3090 Cystitis, unspecified without hematuria: Secondary | ICD-10-CM

## 2014-12-23 DIAGNOSIS — F329 Major depressive disorder, single episode, unspecified: Secondary | ICD-10-CM | POA: Diagnosis not present

## 2014-12-23 DIAGNOSIS — E43 Unspecified severe protein-calorie malnutrition: Secondary | ICD-10-CM

## 2014-12-23 DIAGNOSIS — F419 Anxiety disorder, unspecified: Secondary | ICD-10-CM

## 2015-02-08 ENCOUNTER — Non-Acute Institutional Stay: Payer: Commercial Managed Care - HMO | Admitting: Adult Health

## 2015-02-08 ENCOUNTER — Other Ambulatory Visit: Payer: Self-pay

## 2015-02-08 ENCOUNTER — Telehealth: Payer: Self-pay

## 2015-02-08 DIAGNOSIS — E875 Hyperkalemia: Secondary | ICD-10-CM | POA: Diagnosis not present

## 2015-02-08 DIAGNOSIS — E43 Unspecified severe protein-calorie malnutrition: Secondary | ICD-10-CM | POA: Diagnosis not present

## 2015-02-08 DIAGNOSIS — F29 Unspecified psychosis not due to a substance or known physiological condition: Secondary | ICD-10-CM

## 2015-02-08 DIAGNOSIS — F419 Anxiety disorder, unspecified: Secondary | ICD-10-CM

## 2015-02-08 DIAGNOSIS — G8929 Other chronic pain: Secondary | ICD-10-CM

## 2015-02-08 DIAGNOSIS — E039 Hypothyroidism, unspecified: Secondary | ICD-10-CM | POA: Diagnosis not present

## 2015-02-08 DIAGNOSIS — I5022 Chronic systolic (congestive) heart failure: Secondary | ICD-10-CM | POA: Diagnosis not present

## 2015-02-08 DIAGNOSIS — N3080 Other cystitis without hematuria: Secondary | ICD-10-CM

## 2015-02-08 DIAGNOSIS — F32A Depression, unspecified: Secondary | ICD-10-CM

## 2015-02-08 DIAGNOSIS — R682 Dry mouth, unspecified: Secondary | ICD-10-CM

## 2015-02-08 DIAGNOSIS — N3090 Cystitis, unspecified without hematuria: Secondary | ICD-10-CM

## 2015-02-08 DIAGNOSIS — F0391 Unspecified dementia with behavioral disturbance: Secondary | ICD-10-CM

## 2015-02-08 DIAGNOSIS — F0630 Mood disorder due to known physiological condition, unspecified: Secondary | ICD-10-CM | POA: Diagnosis not present

## 2015-02-08 DIAGNOSIS — I952 Hypotension due to drugs: Secondary | ICD-10-CM

## 2015-02-08 DIAGNOSIS — F03918 Unspecified dementia, unspecified severity, with other behavioral disturbance: Secondary | ICD-10-CM

## 2015-02-08 DIAGNOSIS — F329 Major depressive disorder, single episode, unspecified: Secondary | ICD-10-CM

## 2015-02-08 MED ORDER — ALPRAZOLAM 1 MG PO TABS: ORAL_TABLET | ORAL | Status: DC

## 2015-02-08 MED ORDER — ALPRAZOLAM 0.5 MG PO TABS: 0.5000 mg | ORAL_TABLET | Freq: Two times a day (BID) | ORAL | Status: DC

## 2015-02-08 NOTE — Telephone Encounter (Signed)
error 

## 2015-02-10 ENCOUNTER — Encounter: Payer: Self-pay | Admitting: Adult Health

## 2015-02-10 NOTE — Progress Notes (Signed)
Patient ID: Isabel Chaney, female   DOB: 02/20/1920, 81 y.o.   MRN: [id number]   02/08/15  Facility:  Nursing Home Location:  [facility] Room Number: 801-1 LEVEL OF CARE:  SNF (31)  Chief Complaint  Patient presents with  . Medical Management of Chronic Issues    CHF, hyperkalemia, hypothyroidism, recurrent cystitis, dementia, anxiety, depression, protein calorie malnutrition, hypotension, mood disorder, psychosis and chronic pain     HISTORY OF PRESENT ILLNESS:  This is a 81 year old female who [facility] She is being seen for a routine visit.  She was noted to be sleepy but arousable. She is verbally responsive when talked to. BP 82/68, low. She did not eat breakfast.    PAST MEDICAL HISTORY:  Past Medical History  Diagnosis Date  . Hypothyroidism   . Anemia   . Anxiety   . Depression   . Acute on chronic systolic heart failure (HCC)   . Osteoporosis   . CHF (congestive heart failure) (HCC)   . Chronic kidney disease, stage III (moderate)   . Hypertension   . Memory disorder 06/14/2014  . Abnormality of gait 06/14/2014  . Allergic rhinitis     CURRENT MEDICATIONS: Reviewed per MAR/see medication list   Medication List       This list is accurate as of: 02/08/15 11:59 PM.  Always use your most recent med list.               acetaminophen 325 MG tablet  Commonly known as:  TYLENOL  Take 650 mg by mouth every 4 (four) hours as needed.     ALPRAZolam 0.5 MG tablet  Commonly known as:  XANAX  Take 0.5 mg by mouth 2 (two) times daily as needed for anxiety.     BIOFREEZE 4 % Gel  Generic drug:  Menthol (Topical Analgesic)  Apply topically 2 (two) times daily as needed. For bilateral extremity joints.     Cholecalciferol 4000 UNITS Caps  Take by mouth. Take 2 casapsule =4000 units by mouth daily for supplement     Cranberry 450 MG Caps  Take 1 tablet by mouth daily.     lamoTRIgine 100 MG tablet    Commonly known as:  LAMICTAL  Take 50 mg by mouth daily.     levothyroxine 50 MCG tablet  Commonly known as:  SYNTHROID, LEVOTHROID  Take 50 mcg by mouth daily before breakfast. For Hypothyroid, check pulse weekly     polyethylene glycol packet  Commonly known as:  MIRALAX / GLYCOLAX  Take 17 g by mouth daily. 4-8 oz of liquid daily for constipation *HOLD for diarrhea*     polyvinyl alcohol 1.4 % ophthalmic solution  Commonly known as:  LIQUIFILM TEARS  Place 1 drop into both eyes 2 (two) times daily.     risperiDONE 0.5 MG tablet  Commonly known as:  RISPERDAL  Take 0.5 mg by mouth daily.     sertraline 50 MG tablet  Commonly known as:  ZOLOFT  Take 50 mg by mouth daily. For depression     vitamin B-12 1000 MCG tablet  Commonly known as:  CYANOCOBALAMIN  Take 1,000 mcg by mouth daily. For B-12 supplement         Allergies  Allergen Reactions  . Penicillins     Per MAR     REVIEW OF SYSTEMS:  GENERAL: poor appetite, no fatigue, no fever, chills  RESPIRATORY: no cough,  SOB, DOE, wheezing, hemoptysis CARDIAC: no chest pain, or palpitations GI: no abdominal pain, diarrhea, constipation, heart burn, nausea or vomiting  PHYSICAL EXAMINATION  GENERAL: no acute distress, normal body habitus EYES: conjunctivae normal, sclerae normal, normal eye lids NECK: supple, trachea midline, no neck masses, no thyroid tenderness, no thyromegaly LYMPHATICS: no LAN in the neck, no supraclavicular LAN RESPIRATORY: breathing is even & unlabored, BS CTAB CARDIAC: RRR, no murmur,no extra heart sounds, BLE has unna boots GI: abdomen soft, normal BS, no masses, no tenderness, no hepatomegaly, no splenomegaly EXTREMITIES:  Able to move X 4 extremities; has generalized weakness on BLE; uses wheelchair to move around PSYCHIATRIC: the patient is alert & oriented to person, affect & behavior appropriate   LABS/RADIOLOGY: Labs reviewed: 02/07/15  WBC 9.9 hemoglobin 10.9 hematocrit 33.7  MCV 94.4 platelet 137 potassium 5.5 sodium 142 glucose 142 BUN 55 creatinine 2.07 total bilirubin 1.2 alkaline phosphatase 103 SGOT 36 SGPT 25 total protein 5.2 albumin 3.1 calcium 9.2 BNP 1514; CXR shows no acute cardiopulmonary finding 11/25/14  WBC 5.8 hemoglobin 10.0 hematocrit 31.7 MCV 93.2 platelet 154 11/10/14  WBC 5.5 hemoglobin 11.0 hematocrit 34.1 MCV 89.5 platelet 184 sodium 143 potassium 4.0 glucose 87 BUN 41 creatinine 1.08 calcium 9.3 uric acid 8.0 10/12/14 WBC 5.5 hemoglobin 8.9 hematocrit 27.4 MCV 86.7 platelet 162 sodium 143 potassium 3.5 glucose 96 BUN 23 creatinine 0.83 alkaline phosphatase 79 total bilirubin 0.4 SGOT 14 SGPT 9 total protein 4.5 albumin 2.4 calcium 8.5 uric acid 7.0 09/28/14  WBC 4.6 hemoglobin 9.8 hematocrit 30.2 MCV 36.0 platelet 163 sodium 145 potassium 3.1 glucose 94 BUN 12 creatinine 0.74 calcium 8.8 08/26/14  bilateral lower extremity duplex venous ultrasound is negative for DVT 08/18/14  glucose 130 BUN 18 creatinine 0.99 sodium 144 potassium 3.0 CO2 29 calcium 9.0 TIBC 196 serum iron 48 iron saturation 24% uric acid 9.4 ferritin 213 08/10/14  TSH 1.243 Basic Metabolic Panel:  Recent Labs  16/10/96 08/03/14 2054  NA 141 139  K 3.7 3.6  CL  --  98*  CO2  --  31  GLUCOSE  --  98  BUN 33* 27*  CREATININE 1.1 1.15*  CALCIUM  --  9.5   CBC:  Recent Labs  08/03/14 2054  WBC 4.3  NEUTROABS 2.9  HGB 11.1*  HCT 33.8*  MCV 89.7  PLT 123*    ASSESSMENT/PLAN:  Hyperkalemia - K 5.5, elevated; give kayexalate  15 gm/60 ml PO X 1; BMP in AM  Chronic systolic CHF - discontinue Torsemide and Coreg  Mood disorder - continue Lamictal 50 mg by mouth daily; being seen by [facility]  Hypothyroidism - tsh 1.243; continue Synthroid 50 g 1 tab by mouth daily  Depression - mood is stable; continue Zoloft 50 mg 1 tab by mouth daily  Anxiety - change Xanax to 0.5 mg PO BID PRN  Hypotension -  Discontinue Coreg and Torsemide; change Hydralazine  10 mg 1 tab PO TID PRN; BP/HR BID X 1 week  Chronic pain -  continue Biofreeze 4% gel topically to BLE twice a day when necessary, Tylenol 500 mg 2 tabs by mouth twice a day and tizanidine 2 mg 1 capsule by mouth daily when necessary   Dementia with Psychosis - continue Risperdal 0.5 mg 1 tab by mouth Q HS  Dry mouth - continue Biotene 10 ml swish and spit daily  Protein-calorie malnutrition, severe - albumin 2.4; continue supplementation      Goals of care:  long-term care  [facility], NP [employer] [phone]

## 2015-02-10 NOTE — Progress Notes (Addendum)
Patient ID: Isabel Chaney, female   DOB: 03/23/1919, 81 y.o.   MRN: [id number]   12/23/14  Facility:  Nursing Home Location:  [facility] Room Number: 801-1 LEVEL OF CARE:  SNF (31)  Chief Complaint  Patient presents with  . Medical Management of Chronic Issues    CHF, hypothyroidism, recurrent cystitis, dementia, anxiety, depression, protein calorie malnutrition, hypertension, mood disorder, psychosis and chronic pain     HISTORY OF PRESENT ILLNESS:  This is a 81 year old female who is a long-term care resident of [facility]. She is being seen for a routine visit.  Her CHF is stable. No SOB. Latest tsh is 1.243 and currently on Synthroid for hypothyroidism. Her mood is stable and currently takes Xanax, Risperdal, Zoloft and Lamictal.   PAST MEDICAL HISTORY:  Past Medical History  Diagnosis Date  . Hypothyroidism   . Anemia   . Anxiety   . Depression   . Acute on chronic systolic heart failure (HCC)   . Osteoporosis   . CHF (congestive heart failure) (HCC)   . Chronic kidney disease, stage III (moderate)   . Hypertension   . Memory disorder 06/14/2014  . Abnormality of gait 06/14/2014  . Allergic rhinitis     CURRENT MEDICATIONS: Reviewed per MAR/see medication list    Medication List       This list is accurate as of: 12/23/14 11:59 PM.  Always use your most recent med list.               acetaminophen 325 MG tablet  Commonly known as:  TYLENOL  Take 650 mg by mouth every 4 (four) hours as needed.     ALPRAZolam 1 MG tablet  Commonly known as:  XANAX  Take one tablet by mouth twice daily for anxiety     BIOFREEZE 4 % Gel  Generic drug:  Menthol (Topical Analgesic)  Apply topically 2 (two) times daily as needed. For bilateral extremity joints.     carvedilol 3.125 MG tablet  Commonly known as:  COREG  Take 6.25 mg by mouth 2 (two) times daily with a meal.     Cholecalciferol 4000 UNITS Caps  Take by mouth. Take 2 casapsule  =4000 units by mouth daily for supplement     Cranberry 450 MG Caps  Take 1 tablet by mouth daily.            hydrALAZINE 10 MG tablet  Commonly known as:  APRESOLINE  Take 10 mg by mouth 3 (three) times daily.     lamoTRIgine 100 MG tablet  Commonly known as:  LAMICTAL  Take 50 mg by mouth daily.     levothyroxine 50 MCG tablet  Commonly known as:  SYNTHROID, LEVOTHROID  Take 50 mcg by mouth daily before breakfast. For Hypothyroid, check pulse weekly     polyethylene glycol packet  Commonly known as:  MIRALAX / GLYCOLAX  Take 17 g by mouth daily. 4-8 oz of liquid daily for constipation *HOLD for diarrhea*     polyvinyl alcohol 1.4 % ophthalmic solution  Commonly known as:  LIQUIFILM TEARS  Place 1 drop into both eyes 2 (two) times daily.     potassium chloride 10 MEQ tablet  Commonly known as:  K-DUR,KLOR-CON  Take 10 mEq by mouth every other day.     risperiDONE 0.5 MG tablet  Commonly known as:  RISPERDAL  Take 0.5 mg by mouth 2 (two) times daily.     sertraline  50 MG tablet  Commonly known as:  ZOLOFT  Take 50 mg by mouth daily. For depression     torsemide 10 MG tablet  Commonly known as:  DEMADEX  Take 10 mg by mouth 2 (two) times daily.     vitamin B-12 1000 MCG tablet  Commonly known as:  CYANOCOBALAMIN  Take 1,000 mcg by mouth daily. For B-12 supplement        Allergies  Allergen Reactions  . Penicillins     Per MAR     REVIEW OF SYSTEMS:  GENERAL: no change in appetite, no fatigue, no weight changes, no fever, chills or weakness RESPIRATORY: no cough, SOB, DOE, wheezing, hemoptysis CARDIAC: no chest pain, or palpitations GI: no abdominal pain, diarrhea, constipation, heart burn, nausea or vomiting  PHYSICAL EXAMINATION  GENERAL: no acute distress, normal body habitus EYES: conjunctivae normal, sclerae normal, normal eye lids NECK: supple, trachea midline, no neck masses, no thyroid tenderness, no thyromegaly LYMPHATICS: no LAN in the  neck, no supraclavicular LAN RESPIRATORY: breathing is even & unlabored, BS CTAB CARDIAC: RRR, no murmur,no extra heart sounds, BLE edema 2+ GI: abdomen soft, normal BS, no masses, no tenderness, no hepatomegaly, no splenomegaly EXTREMITIES:  Able to move X 4 extremities; has generalized weakness on BLE; uses wheelchair to move around PSYCHIATRIC: the patient is alert & oriented to person, affect & behavior appropriate  LABS/RADIOLOGY: Labs reviewed: 11/25/14  WBC 5.8 hemoglobin 10.0 hematocrit 31.7 MCV 93.2 platelet 154 11/10/14  WBC 5.5 hemoglobin 11.0 hematocrit 34.1 MCV 89.5 platelet 184 sodium 143 potassium 4.0 glucose 87 BUN 41 creatinine 1.08 calcium 9.3 uric acid 8.0 10/12/14 WBC 5.5 hemoglobin 8.9 hematocrit 27.4 MCV 86.7 platelet 162 sodium 143 potassium 3.5 glucose 96 BUN 23 creatinine 0.83 alkaline phosphatase 79 total bilirubin 0.4 SGOT 14 SGPT 9 total protein 4.5 albumin 2.4 calcium 8.5 uric acid 7.0 09/28/14  WBC 4.6 hemoglobin 9.8 hematocrit 30.2 MCV 36.0 platelet 163 sodium 145 potassium 3.1 glucose 94 BUN 12 creatinine 0.74 calcium 8.8 08/26/14  bilateral lower extremity duplex venous ultrasound is negative for DVT 08/18/14  glucose 130 BUN 18 creatinine 0.99 sodium 144 potassium 3.0 CO2 29 calcium 9.0 TIBC 196 serum iron 48 iron saturation 24% uric acid 9.4 ferritin 213 08/10/14  TSH 1.243 Basic Metabolic Panel:  Recent Labs  96/04/54 08/03/14 2054  NA 141 139  K 3.7 3.6  CL  --  98*  CO2  --  31  GLUCOSE  --  98  BUN 33* 27*  CREATININE 1.1 1.15*  CALCIUM  --  9.5   CBC:  Recent Labs  08/03/14 2054  WBC 4.3  NEUTROABS 2.9  HGB 11.1*  HCT 33.8*  MCV 89.7  PLT 123*    ASSESSMENT/PLAN:  Chronic systolic CHF - stable; continue Demadex 10 mg 1 tab by mouth BID and Coreg 3.125 mg 1 tab by mouth twice a day  Mood disorder - continue Lamictal 50 mg by mouth daily; being seen by [facility]  Hypothyroidism - tsh 1.243; continue Synthroid 50 g 1 tab  by mouth daily  Depression - mood is stable; continue Zoloft 50 mg 1 tab by mouth daily  Anxiety - continue Xanax 1 mg by mouth twice a day  Hypertension - continue hydralazine 10 mg 1 tab by mouth TID and Coreg 3.125 mg 1 tab by mouth twice a day  Chronic pain -  continue Biofreeze 4% gel topically to BLE twice a day when necessary, Tylenol 500 mg 2  tabs by mouth twice a day and tizanidine 2 mg 1 capsule by mouth daily when necessary   Dementia with Psychosis - continue Risperdal 0.5 mg 1 tab by mouth twice a day  Dry mouth - continue Biotene 10 ml swish and spit daily  Protein-calorie malnutrition, severe - albumin 2.4; continue supplementation      Goals of care:  long-term care    [facility], NP [facility] [phone]

## 2015-03-01 LAB — BASIC METABOLIC PANEL
CREATININE: 20 mg/dL — AB (ref 0.5–1.1)
Glucose: 89 mg/dL
Potassium: 2.9 mmol/L — AB (ref 3.4–5.3)
Sodium: 144 mmol/L (ref 137–147)

## 2015-03-03 ENCOUNTER — Telehealth: Payer: Self-pay | Admitting: *Deleted

## 2015-03-06 LAB — BASIC METABOLIC PANEL
BUN: 52 mg/dL — AB (ref 4–21)
CREATININE: 1.9 mg/dL — AB (ref 0.5–1.1)
GLUCOSE: 88 mg/dL
Potassium: 3.5 mmol/L (ref 3.4–5.3)
Sodium: 141 mmol/L (ref 137–147)

## 2015-03-06 NOTE — Telephone Encounter (Signed)
The patient canceled revisit appointment next week, can get sooner revisit with  Nurse practitioner.

## 2015-03-07 ENCOUNTER — Ambulatory Visit: Payer: Commercial Managed Care - HMO | Admitting: Neurology

## 2015-03-07 NOTE — Telephone Encounter (Signed)
Called pt daughter, Okey RegalCarol, back. Ok per [employer]. Please schedule with either Aundra MilletMegan or Eber Jonesarolyn. Ok per Dr Anne HahnWillis. Left detailed VM. Asked her to call back and schedule appt. Gave [facility] phone number and hours.

## 2015-03-29 LAB — BASIC METABOLIC PANEL
BUN: 57 mg/dL — AB (ref 4–21)
BUN: 57 mg/dL — AB (ref 4–21)
Creatinine: 1.4 mg/dL — AB (ref 0.5–1.1)
Creatinine: 1.4 mg/dL — AB (ref 0.5–1.1)
GLUCOSE: 92 mg/dL
GLUCOSE: 92 mg/dL
Potassium: 3.3 mmol/L — AB (ref 3.4–5.3)
Potassium: 3.3 mmol/L — AB (ref 3.4–5.3)
SODIUM: 136 mmol/L — AB (ref 137–147)
SODIUM: 136 mmol/L — AB (ref 137–147)
Sodium: 136 mmol/L — AB (ref 137–147)
Sodium: 136 mmol/L — AB (ref 137–147)

## 2015-04-11 ENCOUNTER — Emergency Department (HOSPITAL_COMMUNITY): Payer: Commercial Managed Care - HMO

## 2015-04-11 ENCOUNTER — Encounter: Payer: Self-pay | Admitting: Adult Health

## 2015-04-11 ENCOUNTER — Inpatient Hospital Stay (HOSPITAL_COMMUNITY)
Admission: EM | Admit: 2015-04-11 | Discharge: 2015-04-19 | DRG: 308 | Disposition: E | Payer: Commercial Managed Care - HMO | Attending: Internal Medicine | Admitting: Internal Medicine

## 2015-04-11 ENCOUNTER — Encounter (HOSPITAL_COMMUNITY): Payer: Self-pay

## 2015-04-11 DIAGNOSIS — I429 Cardiomyopathy, unspecified: Secondary | ICD-10-CM | POA: Diagnosis present

## 2015-04-11 DIAGNOSIS — F418 Other specified anxiety disorders: Secondary | ICD-10-CM | POA: Diagnosis present

## 2015-04-11 DIAGNOSIS — Z9841 Cataract extraction status, right eye: Secondary | ICD-10-CM

## 2015-04-11 DIAGNOSIS — I130 Hypertensive heart and chronic kidney disease with heart failure and stage 1 through stage 4 chronic kidney disease, or unspecified chronic kidney disease: Secondary | ICD-10-CM | POA: Diagnosis present

## 2015-04-11 DIAGNOSIS — I509 Heart failure, unspecified: Secondary | ICD-10-CM | POA: Diagnosis not present

## 2015-04-11 DIAGNOSIS — Z90710 Acquired absence of both cervix and uterus: Secondary | ICD-10-CM | POA: Diagnosis not present

## 2015-04-11 DIAGNOSIS — R0602 Shortness of breath: Secondary | ICD-10-CM | POA: Diagnosis not present

## 2015-04-11 DIAGNOSIS — Z888 Allergy status to other drugs, medicaments and biological substances status: Secondary | ICD-10-CM

## 2015-04-11 DIAGNOSIS — I5043 Acute on chronic combined systolic (congestive) and diastolic (congestive) heart failure: Secondary | ICD-10-CM | POA: Diagnosis present

## 2015-04-11 DIAGNOSIS — Z515 Encounter for palliative care: Secondary | ICD-10-CM | POA: Diagnosis present

## 2015-04-11 DIAGNOSIS — I959 Hypotension, unspecified: Secondary | ICD-10-CM | POA: Diagnosis not present

## 2015-04-11 DIAGNOSIS — I350 Nonrheumatic aortic (valve) stenosis: Secondary | ICD-10-CM | POA: Diagnosis present

## 2015-04-11 DIAGNOSIS — H9190 Unspecified hearing loss, unspecified ear: Secondary | ICD-10-CM | POA: Diagnosis present

## 2015-04-11 DIAGNOSIS — Z79899 Other long term (current) drug therapy: Secondary | ICD-10-CM

## 2015-04-11 DIAGNOSIS — F329 Major depressive disorder, single episode, unspecified: Secondary | ICD-10-CM | POA: Diagnosis present

## 2015-04-11 DIAGNOSIS — R627 Adult failure to thrive: Secondary | ICD-10-CM | POA: Insufficient documentation

## 2015-04-11 DIAGNOSIS — F0391 Unspecified dementia with behavioral disturbance: Secondary | ICD-10-CM | POA: Diagnosis present

## 2015-04-11 DIAGNOSIS — I248 Other forms of acute ischemic heart disease: Secondary | ICD-10-CM | POA: Diagnosis present

## 2015-04-11 DIAGNOSIS — G8929 Other chronic pain: Secondary | ICD-10-CM | POA: Diagnosis present

## 2015-04-11 DIAGNOSIS — F419 Anxiety disorder, unspecified: Secondary | ICD-10-CM | POA: Diagnosis present

## 2015-04-11 DIAGNOSIS — E876 Hypokalemia: Secondary | ICD-10-CM | POA: Diagnosis present

## 2015-04-11 DIAGNOSIS — I5021 Acute systolic (congestive) heart failure: Secondary | ICD-10-CM | POA: Diagnosis not present

## 2015-04-11 DIAGNOSIS — I4891 Unspecified atrial fibrillation: Secondary | ICD-10-CM | POA: Diagnosis present

## 2015-04-11 DIAGNOSIS — Z880 Allergy status to penicillin: Secondary | ICD-10-CM

## 2015-04-11 DIAGNOSIS — M810 Age-related osteoporosis without current pathological fracture: Secondary | ICD-10-CM | POA: Diagnosis present

## 2015-04-11 DIAGNOSIS — L89153 Pressure ulcer of sacral region, stage 3: Secondary | ICD-10-CM | POA: Diagnosis present

## 2015-04-11 DIAGNOSIS — E039 Hypothyroidism, unspecified: Secondary | ICD-10-CM | POA: Diagnosis present

## 2015-04-11 DIAGNOSIS — Z7401 Bed confinement status: Secondary | ICD-10-CM

## 2015-04-11 DIAGNOSIS — N189 Chronic kidney disease, unspecified: Secondary | ICD-10-CM | POA: Diagnosis not present

## 2015-04-11 DIAGNOSIS — Z9842 Cataract extraction status, left eye: Secondary | ICD-10-CM

## 2015-04-11 DIAGNOSIS — Z87891 Personal history of nicotine dependence: Secondary | ICD-10-CM | POA: Diagnosis not present

## 2015-04-11 DIAGNOSIS — E875 Hyperkalemia: Secondary | ICD-10-CM | POA: Diagnosis present

## 2015-04-11 DIAGNOSIS — I10 Essential (primary) hypertension: Secondary | ICD-10-CM | POA: Diagnosis not present

## 2015-04-11 DIAGNOSIS — N179 Acute kidney failure, unspecified: Secondary | ICD-10-CM | POA: Diagnosis not present

## 2015-04-11 DIAGNOSIS — N183 Chronic kidney disease, stage 3 unspecified: Secondary | ICD-10-CM | POA: Diagnosis present

## 2015-04-11 DIAGNOSIS — F03918 Unspecified dementia, unspecified severity, with other behavioral disturbance: Secondary | ICD-10-CM | POA: Diagnosis present

## 2015-04-11 DIAGNOSIS — R600 Localized edema: Secondary | ICD-10-CM

## 2015-04-11 DIAGNOSIS — I5023 Acute on chronic systolic (congestive) heart failure: Secondary | ICD-10-CM | POA: Diagnosis not present

## 2015-04-11 DIAGNOSIS — E46 Unspecified protein-calorie malnutrition: Secondary | ICD-10-CM | POA: Diagnosis present

## 2015-04-11 DIAGNOSIS — I272 Other secondary pulmonary hypertension: Secondary | ICD-10-CM | POA: Diagnosis present

## 2015-04-11 DIAGNOSIS — Z8249 Family history of ischemic heart disease and other diseases of the circulatory system: Secondary | ICD-10-CM | POA: Diagnosis not present

## 2015-04-11 DIAGNOSIS — Z66 Do not resuscitate: Secondary | ICD-10-CM | POA: Diagnosis present

## 2015-04-11 DIAGNOSIS — D631 Anemia in chronic kidney disease: Secondary | ICD-10-CM | POA: Diagnosis present

## 2015-04-11 DIAGNOSIS — I5033 Acute on chronic diastolic (congestive) heart failure: Secondary | ICD-10-CM | POA: Diagnosis not present

## 2015-04-11 HISTORY — DX: Hypokalemia: E87.6

## 2015-04-11 LAB — BASIC METABOLIC PANEL
ANION GAP: 12 (ref 5–15)
BUN: 58 mg/dL — ABNORMAL HIGH (ref 6–20)
CO2: 22 mmol/L (ref 22–32)
Calcium: 8.9 mg/dL (ref 8.9–10.3)
Chloride: 107 mmol/L (ref 101–111)
Creatinine, Ser: 1.32 mg/dL — ABNORMAL HIGH (ref 0.44–1.00)
GFR calc Af Amer: 39 mL/min — ABNORMAL LOW (ref >60)
GFR, EST NON AFRICAN AMERICAN: 34 mL/min — AB (ref >60)
Glucose, Bld: 151 mg/dL — ABNORMAL HIGH (ref 65–99)
POTASSIUM: 3.3 mmol/L — AB (ref 3.5–5.1)
SODIUM: 141 mmol/L (ref 135–145)

## 2015-04-11 LAB — URINALYSIS, ROUTINE W REFLEX MICROSCOPIC
Bilirubin Urine: NEGATIVE
GLUCOSE, UA: NEGATIVE mg/dL
KETONES UR: NEGATIVE mg/dL
NITRITE: NEGATIVE
PROTEIN: NEGATIVE mg/dL
Specific Gravity, Urine: 1.015 (ref 1.005–1.030)
pH: 5.5 (ref 5.0–8.0)

## 2015-04-11 LAB — I-STAT CG4 LACTIC ACID, ED: LACTIC ACID, VENOUS: 2.02 mmol/L — AB (ref 0.5–2.0)

## 2015-04-11 LAB — CBC
HEMATOCRIT: 34.1 % — AB (ref 36.0–46.0)
HEMOGLOBIN: 10.6 g/dL — AB (ref 12.0–15.0)
MCH: 29.9 pg (ref 26.0–34.0)
MCHC: 31.1 g/dL (ref 30.0–36.0)
MCV: 96.3 fL (ref 78.0–100.0)
Platelets: 153 10*3/uL (ref 150–400)
RBC: 3.54 MIL/uL — AB (ref 3.87–5.11)
RDW: 17.6 % — ABNORMAL HIGH (ref 11.5–15.5)
WBC: 5.5 10*3/uL (ref 4.0–10.5)

## 2015-04-11 LAB — BRAIN NATRIURETIC PEPTIDE: B Natriuretic Peptide: 768.1 pg/mL — ABNORMAL HIGH (ref 0.0–100.0)

## 2015-04-11 LAB — URINE MICROSCOPIC-ADD ON

## 2015-04-11 MED ORDER — FUROSEMIDE 10 MG/ML IJ SOLN
20.0000 mg | Freq: Once | INTRAMUSCULAR | Status: AC
  Administered 2015-04-11: 20 mg via INTRAVENOUS
  Filled 2015-04-11: qty 2

## 2015-04-11 MED ORDER — DILTIAZEM HCL 100 MG IV SOLR
5.0000 mg/h | Freq: Once | INTRAVENOUS | Status: AC
  Administered 2015-04-11: 5 mg/h via INTRAVENOUS
  Filled 2015-04-11: qty 100

## 2015-04-11 NOTE — ED Notes (Signed)
Per [facility]: Pt from [facility]. Called out for edema in legs, went to nephrologist, stage 3 kidney disease, heard crackles in lungs. Gross pitting edema all the way up legs and abdomen, arms, and face. No recorded history of A fib. Facility has been wrapping legs over the past few days, legs have been weeping. 12 Lead showed A Fib and mult focal PVCs. Pt on 2 L [place] on arrival, improved Oxygen sats to 97%. Only pain is in legs.

## 2015-04-11 NOTE — Progress Notes (Signed)
This encounter was created in error - please disregard.

## 2015-04-11 NOTE — ED Notes (Signed)
Patient transported to X-ray 

## 2015-04-11 NOTE — Progress Notes (Signed)
Patient ID: Isabel Chaney, female   DOB: March 26, 1919, 82 y.o.   MRN: [id number]

## 2015-04-11 NOTE — ED Provider Notes (Signed)
CSN: [id number]     Arrival date & time 04/09/2015  1917 History   First MD Initiated Contact with Patient 03/30/2015 1917     Chief Complaint  Patient presents with  . Atrial Fibrillation  . Edema     (Consider location/radiation/quality/duration/timing/severity/associated sxs/prior Treatment) HPI Patient was brought in by family members for concern of increasing lower extremity edema. Family members report that the nursing staff has been wrapping her lower legs for edema but now she has a lot of edema over her knees and thighs. They're concerned about seeping and drainage is occurring. The patient reports she's also felt short of breath for several days. He denies cough or chest pain. There has not been any reported fever. Today she was seen by her nephrologist. She was instructed to come to the emergency department for further diagnostic evaluation and treatment. Past Medical History  Diagnosis Date  . Hypothyroidism   . Anemia   . Anxiety   . Depression   . Acute on chronic systolic heart failure ([place])   . Osteoporosis   . CHF (congestive heart failure) ([place])   . Chronic kidney disease, stage III (moderate)   . Hypertension   . Memory disorder 06/14/2014  . Abnormality of gait 06/14/2014  . Allergic rhinitis   . Recurrent cystitis   . Hypomanic psychosis single episode or unspecified   . Protein calorie malnutrition ([place])   . Chronic pain   . Dry mouth   . Hypotension due to drugs   . Hypokalemia    Past Surgical History  Procedure Laterality Date  . Cataract extraction Bilateral   . Abdominal hysterectomy     Family History  Problem Relation Age of Onset  . Heart attack Mother   . Hypertension Mother   . Heart attack Father   . Hypertension Father   . Heart disease Sister   . Heart disease Brother   . Heart attack Brother   . Heart disease Sister   . Dementia Sister   . Heart disease Brother   . Heart attack Brother   . Stroke Brother    Social History   Substance Use Topics  . Smoking status: Former Smoker    Quit date: 03/18/1964  . Smokeless tobacco: Never Used  . Alcohol Use: No   OB History    No data available     Review of Systems 10 Systems reviewed and are negative for acute change except as noted in the HPI.    Allergies  Atorvastatin; Penicillins; and Simvastatin  Home Medications   Prior to Admission medications   Medication Sig Start Date End Date Taking? Authorizing Provider  acetaminophen (TYLENOL) 325 MG tablet Take 650 mg by mouth every 4 (four) hours as needed.    Historical Provider, MD  acetaminophen (TYLENOL) 500 MG tablet Take 1,000 mg by mouth 2 (two) times daily.    Historical Provider, MD  ALPRAZolam Duanne Moron) 0.5 MG tablet Take 0.5 mg by mouth 2 (two) times daily as needed for anxiety.    Historical Provider, MD  antiseptic oral rinse (BIOTENE) LIQD 10 mLs by Mouth Rinse route daily.    Historical Provider, MD  Cholecalciferol 4000 UNITS CAPS Take by mouth. Take 2 casapsule =4000 units by mouth daily for supplement    Historical Provider, MD  Cranberry 450 MG CAPS Take 1 tablet by mouth daily.    Historical Provider, MD  Eyelid Cleansers (OCUSOFT LID SCRUB) PADS Apply topically. Use as directed every morning followed by Systane  Ultra    Historical Provider, MD  ferrous sulfate 325 (65 FE) MG tablet Take 325 mg by mouth 2 (two) times daily.    Historical Provider, MD  hydrALAZINE (APRESOLINE) 10 MG tablet Take 10 mg by mouth 3 (three) times daily as needed (SP >160).    Historical Provider, MD  lamoTRIgine (LAMICTAL) 100 MG tablet Take 50 mg by mouth daily.    Historical Provider, MD  levothyroxine (SYNTHROID, LEVOTHROID) 50 MCG tablet Take 50 mcg by mouth daily before breakfast. For Hypothyroid, check pulse weekly    Historical Provider, MD  loperamide (IMODIUM) 2 MG capsule TAKE 2 TABLETS='4MG'$  BY MOUTH INITIALLY FOLLOWED BY 2 MG AFTER EACH EPISODE. DO NOT EXCEED '16MG'$  IN 24 HOURS. USE HOUSE STOCK.     Historical Provider, MD  Menthol, Topical Analgesic, (BIOFREEZE) 4 % GEL Apply topically 2 (two) times daily as needed. For bilateral extremity joints.    Historical Provider, MD  Multiple Vitamins-Minerals (DECUBI-VITE) CAPS Take 1 capsule by mouth daily.    Historical Provider, MD  nitroGLYCERIN (NITROSTAT) 0.4 MG SL tablet Place 0.4 mg under the tongue every 5 (five) minutes as needed for chest pain (for 3 doses).    Historical Provider, MD  Optical Supplies ([place]) KIT by Does not apply route. USE AS DIRECTED ON EACH EYE EVERY MORNING FOLLOWED BY SYSTANE ULTRA    Historical Provider, MD  Polyethyl Glycol-Propyl Glycol (SYSTANE ULTRA) 0.4-0.3 % SOLN Apply to eye. Instill 1 drop into each eye every morning after using LID SCRUBS. Wait 3-5 minutes between 2 eye drops    Historical Provider, MD  Polyethyl Glycol-Propyl Glycol (SYSTANE) 0.4-0.3 % GEL ophthalmic gel INSTILL 1 DROP TO EACH EYE EVERY NIGHT AT  BEDTIME. WAIT 3-5 MINUTES BETWEEN 2 EYE MEDS. (PER PT REQUEST-ADMINISTER AT 6 PM)    Historical Provider, MD  polyethylene glycol (MIRALAX / GLYCOLAX) packet Take 17 g by mouth daily. 4-8 oz of liquid daily for constipation *HOLD for diarrhea*    Historical Provider, MD  Potassium Chloride (KCL-20 PO) Take 20 mEq by mouth daily. For five (5) days    Historical Provider, MD  Protein (PROCEL PO) Take 1 scoop by mouth 2 (two) times daily.    Historical Provider, MD  risperiDONE (RISPERDAL) 0.5 MG tablet Take 0.5 mg by mouth daily.     Historical Provider, MD  sertraline (ZOLOFT) 50 MG tablet Take 50 mg by mouth daily. For depression    Historical Provider, MD  tizanidine (ZANAFLEX) 2 MG capsule Take 2 mg by mouth daily as needed for muscle spasms.    Historical Provider, MD  torsemide (DEMADEX) 10 MG tablet Take 10 mg by mouth daily. For five (5) days    Historical Provider, MD  vitamin B-12 (CYANOCOBALAMIN) 1000 MCG tablet Take 1,000 mcg by mouth daily. For B-12 supplement     Historical Provider, MD   BP 110/87 mmHg  Pulse 95  Temp(Src) 99.1 F (37.3 C) (Rectal)  Resp 20  SpO2 99% Physical Exam  Constitutional:  Patient is alert and nontoxic. She does not have significant respiratory distress at rest. For her age she is in good physical condition.  HENT:  Head: Normocephalic and atraumatic.  Mouth/Throat: Oropharynx is clear and moist.  Eyes: EOM are normal. Pupils are equal, round, and reactive to light.  Neck: Neck supple.  Cardiovascular:  Tachycardia irregularly irregular.  Pulmonary/Chest: Effort normal. She has rales.  Mild basilar rails.  Abdominal: Soft. Bowel sounds are normal. She exhibits no  distension. There is no tenderness.  Musculoskeletal: She exhibits edema.  Patient is wrapped dressings on the lower legs. She has 2-3+ edema from the knees and the thighs.  Neurological: She is alert. Coordination normal.  Skin: Skin is warm and dry.  Psychiatric: She has a normal mood and affect.    ED Course  Procedures (including critical care time) CRITICAL CARE Performed by: Charlesetta Shanks   Total critical care time: 30 minutes  Critical care time was exclusive of separately billable procedures and treating other patients.  Critical care was necessary to treat or prevent imminent or life-threatening deterioration.  Critical care was time spent personally by me on the following activities: development of treatment plan with patient and/or surrogate as well as nursing, discussions with consultants, evaluation of patient's response to treatment, examination of patient, obtaining history from patient or surrogate, ordering and performing treatments and interventions, ordering and review of laboratory studies, ordering and review of radiographic studies, pulse oximetry and re-evaluation of patient's condition. Labs Review Labs Reviewed  BASIC METABOLIC PANEL - Abnormal; Notable for the following:    Potassium 3.3 (*)    Glucose, Bld 151 (*)     BUN 58 (*)    Creatinine, Ser 1.32 (*)    GFR calc non Af Amer 34 (*)    GFR calc Af Amer 39 (*)    All other components within normal limits  CBC - Abnormal; Notable for the following:    RBC 3.54 (*)    Hemoglobin 10.6 (*)    HCT 34.1 (*)    RDW 17.6 (*)    All other components within normal limits  BRAIN NATRIURETIC PEPTIDE - Abnormal; Notable for the following:    B Natriuretic Peptide 768.1 (*)    All other components within normal limits  URINALYSIS, ROUTINE W REFLEX MICROSCOPIC (NOT AT [facility]) - Abnormal; Notable for the following:    APPearance TURBID (*)    Hgb urine dipstick SMALL (*)    Leukocytes, UA SMALL (*)    All other components within normal limits  URINE MICROSCOPIC-ADD ON - Abnormal; Notable for the following:    Squamous Epithelial / LPF 6-30 (*)    Bacteria, UA MANY (*)    All other components within normal limits  I-STAT CG4 LACTIC ACID, ED - Abnormal; Notable for the following:    Lactic Acid, Venous 2.02 (*)    All other components within normal limits  CULTURE, BLOOD (ROUTINE X 2)  CULTURE, BLOOD (ROUTINE X 2)  HEPATIC FUNCTION PANEL  LIPASE, BLOOD  TROPONIN I    Imaging Review Dg Chest 2 View  03/22/2015  CLINICAL DATA:  Pitting edema throughout both legs and into the abdomen, arms, and face. History of stage 3 kidney disease. EXAM: CHEST  2 VIEW COMPARISON:  09/30/2012 FINDINGS: Cardiac enlargement. No significant vascular congestion or edema. No focal consolidation in the lungs. Atelectasis or infiltration in the left lung base. Bilateral pleural effusions. No pneumothorax. Calcified and tortuous aorta. Degenerative changes in the spine and shoulders. IMPRESSION: Cardiac enlargement with bilateral pleural effusions. Basilar atelectasis. Electronically Signed   By: Lucienne Capers M.D.   On: 04/08/2015 20:08   I have personally reviewed and evaluated these images and lab results as part of my medical decision-making.   EKG  Interpretation   Date/Time:  Tuesday April 11 2015 19:28:36 EST Ventricular Rate:  128 PR Interval:    QRS Duration: 100 QT Interval:  282 QTC Calculation: 411 R Axis:  6 Text Interpretation:  Atrial fibrillation Repolarization abnormality, prob  rate related agree.no STEMI Confirmed by Johnney Killian, MD, Jeannie Done [phone]) on  03/21/2015 8:25:08 PM      MDM   Final diagnoses:  Atrial fibrillation with rapid ventricular response (HCC)  Acute on chronic congestive heart failure, unspecified congestive heart failure type [facility])   Patient presents with increasing lower extremity swelling and dyspnea. She has new onset of H of fibrillation. Chest x-ray and BMP suggest congestive heart failure. Patient is treated with rate control with diltiazem drip and Lasix for volume overload.    Charlesetta Shanks, MD 04/12/15 0005

## 2015-04-11 NOTE — ED Notes (Signed)
MD at bedside. 

## 2015-04-11 NOTE — H&P (Signed)
PCP:  Blanchie Serve, MD  Neurology Lottie Mussel  Referring provider [place]   Chief Complaint: Shortness of breath   HPI: Isabel Chaney is a 82 y.o. female   has a past medical history of Hypothyroidism; Anemia; Anxiety; Depression; Acute on chronic systolic heart failure ([place]); Osteoporosis; CHF (congestive heart failure) ([place]); Chronic kidney disease, stage III (moderate); Hypertension; Memory disorder (06/14/2014); Abnormality of gait (06/14/2014); Allergic rhinitis; Recurrent cystitis; Hypomanic psychosis single episode or unspecified; Protein calorie malnutrition ([place]); Chronic pain; Dry mouth; Hypotension due to drugs; and Hypokalemia.   Presented with an currently resides at [facility]. She's been having some lower extremity edema for some time and the staff has been wrapping her legs. She has known history of stage III kidney disease and was seen by nephrology who examined her and was worried for fluid overload. Patient was sent to emerge department she was found to have a new onset atrial fibrillation with RVR heart rates up to 120s.  In ER:  Patient was put on 12 L of nasal cannula and oxygen saturation was noted to be 97. She was started on Cardizem drip. Potassium noted to be 3.3 lactic acid 2.0 chest x-ray showing cardiac enlargement and bilateral pleural effusions    Hospitalist was called for admission for acute on chronic systolic heart failure and A.fib with RVR  Review of Systems:    Pertinent positives include: Bilateral lower extremity swelling,  shortness of breath at rest GI:   Constitutional:  No weight loss, night sweats, Fevers, chills, fatigue, weight loss  HEENT:  No headaches, Difficulty swallowing,Tooth/dental problems,Sore throat,  No sneezing, itching, ear ache, nasal congestion, post nasal drip,  Cardio-vascular:  No chest pain, Orthopnea, PND, anasarca, dizziness, palpitations.no  No heartburn, indigestion, abdominal pain, nausea, vomiting,  diarrhea, change in bowel habits, loss of appetite, melena, blood in stool, hematemesis Resp:  no . No dyspnea on exertion, No excess mucus, no productive cough, No non-productive cough, No coughing up of blood.No change in color of mucus.No wheezing. Skin:  no rash or lesions. No jaundice GU:  no dysuria, change in color of urine, no urgency or frequency. No straining to urinate.  No flank pain.  Musculoskeletal:  No joint pain or no joint swelling. No decreased range of motion. No back pain.  Psych:  No change in mood or affect. No depression or anxiety. No memory loss.  Neuro: no localizing neurological complaints, no tingling, no weakness, no double vision, no gait abnormality, no slurred speech, no confusion  Otherwise ROS are negative except for above, 10 systems were reviewed  Past Medical History: Past Medical History  Diagnosis Date  . Hypothyroidism   . Anemia   . Anxiety   . Depression   . Acute on chronic systolic heart failure ([place])   . Osteoporosis   . CHF (congestive heart failure) ([place])   . Chronic kidney disease, stage III (moderate)   . Hypertension   . Memory disorder 06/14/2014  . Abnormality of gait 06/14/2014  . Allergic rhinitis   . Recurrent cystitis   . Hypomanic psychosis single episode or unspecified   . Protein calorie malnutrition ([place])   . Chronic pain   . Dry mouth   . Hypotension due to drugs   . Hypokalemia    Past Surgical History  Procedure Laterality Date  . Cataract extraction Bilateral   . Abdominal hysterectomy       Medications: Prior to Admission medications   Medication Sig Start Date  End Date Taking? Authorizing Provider  acetaminophen (TYLENOL) 325 MG tablet Take 650 mg by mouth every 4 (four) hours as needed.    Historical Provider, MD  acetaminophen (TYLENOL) 500 MG tablet Take 1,000 mg by mouth 2 (two) times daily.    Historical Provider, MD  ALPRAZolam Duanne Moron) 0.5 MG tablet Take 0.5 mg by mouth 2 (two) times daily as  needed for anxiety.    Historical Provider, MD  antiseptic oral rinse (BIOTENE) LIQD 10 mLs by Mouth Rinse route daily.    Historical Provider, MD  Cholecalciferol 4000 UNITS CAPS Take by mouth. Take 2 casapsule =4000 units by mouth daily for supplement    Historical Provider, MD  Cranberry 450 MG CAPS Take 1 tablet by mouth daily.    Historical Provider, MD  Eyelid Cleansers (OCUSOFT LID SCRUB) PADS Apply topically. Use as directed every morning followed by Systane Ultra    Historical Provider, MD  ferrous sulfate 325 (65 FE) MG tablet Take 325 mg by mouth 2 (two) times daily.    Historical Provider, MD  hydrALAZINE (APRESOLINE) 10 MG tablet Take 10 mg by mouth 3 (three) times daily as needed (SP >160).    Historical Provider, MD  lamoTRIgine (LAMICTAL) 100 MG tablet Take 50 mg by mouth daily.    Historical Provider, MD  levothyroxine (SYNTHROID, LEVOTHROID) 50 MCG tablet Take 50 mcg by mouth daily before breakfast. For Hypothyroid, check pulse weekly    Historical Provider, MD  loperamide (IMODIUM) 2 MG capsule TAKE 2 TABLETS='4MG'$  BY MOUTH INITIALLY FOLLOWED BY 2 MG AFTER EACH EPISODE. DO NOT EXCEED '16MG'$  IN 24 HOURS. USE HOUSE STOCK.    Historical Provider, MD  Menthol, Topical Analgesic, (BIOFREEZE) 4 % GEL Apply topically 2 (two) times daily as needed. For bilateral extremity joints.    Historical Provider, MD  Multiple Vitamins-Minerals (DECUBI-VITE) CAPS Take 1 capsule by mouth daily.    Historical Provider, MD  nitroGLYCERIN (NITROSTAT) 0.4 MG SL tablet Place 0.4 mg under the tongue every 5 (five) minutes as needed for chest pain (for 3 doses).    Historical Provider, MD  Optical Supplies ([place]) KIT by Does not apply route. USE AS DIRECTED ON EACH EYE EVERY MORNING FOLLOWED BY SYSTANE ULTRA    Historical Provider, MD  Polyethyl Glycol-Propyl Glycol (SYSTANE ULTRA) 0.4-0.3 % SOLN Apply to eye. Instill 1 drop into each eye every morning after using LID SCRUBS. Wait 3-5  minutes between 2 eye drops    Historical Provider, MD  Polyethyl Glycol-Propyl Glycol (SYSTANE) 0.4-0.3 % GEL ophthalmic gel INSTILL 1 DROP TO EACH EYE EVERY NIGHT AT  BEDTIME. WAIT 3-5 MINUTES BETWEEN 2 EYE MEDS. (PER PT REQUEST-ADMINISTER AT 6 PM)    Historical Provider, MD  polyethylene glycol (MIRALAX / GLYCOLAX) packet Take 17 g by mouth daily. 4-8 oz of liquid daily for constipation *HOLD for diarrhea*    Historical Provider, MD  Potassium Chloride (KCL-20 PO) Take 20 mEq by mouth daily. For five (5) days    Historical Provider, MD  Protein (PROCEL PO) Take 1 scoop by mouth 2 (two) times daily.    Historical Provider, MD  risperiDONE (RISPERDAL) 0.5 MG tablet Take 0.5 mg by mouth daily.     Historical Provider, MD  sertraline (ZOLOFT) 50 MG tablet Take 50 mg by mouth daily. For depression    Historical Provider, MD  tizanidine (ZANAFLEX) 2 MG capsule Take 2 mg by mouth daily as needed for muscle spasms.    Historical Provider, MD  torsemide (DEMADEX) 10 MG tablet Take 10 mg by mouth daily. For five (5) days    Historical Provider, MD  vitamin B-12 (CYANOCOBALAMIN) 1000 MCG tablet Take 1,000 mcg by mouth daily. For B-12 supplement    Historical Provider, MD    Allergies:   Allergies  Allergen Reactions  . Atorvastatin     Other reaction(s): MYALGIA  . Penicillins     Per MAR  . Simvastatin     Other reaction(s): HEADACHE    Social History:     From facility [facility]   reports that she quit smoking about 51 years ago. She has never used smokeless tobacco. She reports that she does not drink alcohol or use illicit drugs.    Family History: family history includes Dementia in her sister; Heart attack in her brother, brother, father, and mother; Heart disease in her brother, brother, sister, and sister; Hypertension in her father and mother; Stroke in her brother.    Physical Exam: Patient Vitals for the past 24 hrs:  BP Temp Temp src Pulse Resp SpO2  03/22/2015 2115  114/68 mmHg - - 114 (!) 34 98 %  04/07/2015 2100 106/91 mmHg - - 108 21 96 %  03/31/2015 2058 - 99.1 F (37.3 C) Rectal - - -  03/31/2015 2045 120/80 mmHg - - (!) 55 22 94 %  03/27/2015 2030 115/82 mmHg - - 104 19 98 %  04/07/2015 1930 - - - - - 94 %  04/03/2015 1926 112/94 mmHg 99.1 F (37.3 C) Oral (!) 47 17 94 %  03/26/2015 1922 - - - - - 97 %    1. General:  in No Acute distress 2. Psychological: Alert and  Oriented to self 3. Head/ENT:   Moist Mucous Membranes                          Head Non traumatic, neck supple                          Normal Dentition 4. SKIN: normal   Skin turgor,  Skin clean Dry and multiple abrasions    5. Heart: Regular rate and rhythm no Murmur, Rub or gallop 6. Lungs:  no wheezes some  crackles   7. Abdomen: Soft, non-tender, Non distended 8. Lower extremities: no clubbing, cyanosis, 2+edema in the thighs, edema legs controlled to the wraps after removal left leg appears to be larger than the right 9. Neurologically Grossly intact, moving all 4 extremities equally 10. MSK: Normal range of motion  body mass index is unknown because there is no weight on file.   Labs on Admission:   Results for orders placed or performed during the hospital encounter of 04/09/2015 (from the past 24 hour(s))  Basic metabolic panel     Status: Abnormal   Collection Time: 04/12/2015  7:48 PM  Result Value Ref Range   Sodium 141 135 - 145 mmol/L   Potassium 3.3 (L) 3.5 - 5.1 mmol/L   Chloride 107 101 - 111 mmol/L   CO2 22 22 - 32 mmol/L   Glucose, Bld 151 (H) 65 - 99 mg/dL   BUN 58 (H) 6 - 20 mg/dL   Creatinine, Ser 1.32 (H) 0.44 - 1.00 mg/dL   Calcium 8.9 8.9 - 10.3 mg/dL   GFR calc non Af Amer 34 (L) >60 mL/min   GFR calc Af Amer 39 (L) >60 mL/min   Anion  gap 12 5 - 15  CBC     Status: Abnormal   Collection Time: 04/05/2015  7:48 PM  Result Value Ref Range   WBC 5.5 4.0 - 10.5 K/uL   RBC 3.54 (L) 3.87 - 5.11 MIL/uL   Hemoglobin 10.6 (L) 12.0 - 15.0 g/dL   HCT 34.1 (L)  36.0 - 46.0 %   MCV 96.3 78.0 - 100.0 fL   MCH 29.9 26.0 - 34.0 pg   MCHC 31.1 30.0 - 36.0 g/dL   RDW 17.6 (H) 11.5 - 15.5 %   Platelets 153 150 - 400 K/uL  Brain natriuretic peptide     Status: Abnormal   Collection Time: 03/22/2015  7:48 PM  Result Value Ref Range   B Natriuretic Peptide 768.1 (H) 0.0 - 100.0 pg/mL  I-Stat CG4 Lactic Acid, ED     Status: Abnormal   Collection Time: 03/21/2015  8:09 PM  Result Value Ref Range   Lactic Acid, Venous 2.02 (HH) 0.5 - 2.0 mmol/L   Comment NOTIFIED PHYSICIAN   Urinalysis, Routine w reflex microscopic     Status: Abnormal   Collection Time: 04/05/2015 10:07 PM  Result Value Ref Range   Color, Urine YELLOW YELLOW   APPearance TURBID (A) CLEAR   Specific Gravity, Urine 1.015 1.005 - 1.030   pH 5.5 5.0 - 8.0   Glucose, UA NEGATIVE NEGATIVE mg/dL   Hgb urine dipstick SMALL (A) NEGATIVE   Bilirubin Urine NEGATIVE NEGATIVE   Ketones, ur NEGATIVE NEGATIVE mg/dL   Protein, ur NEGATIVE NEGATIVE mg/dL   Nitrite NEGATIVE NEGATIVE   Leukocytes, UA SMALL (A) NEGATIVE  Urine microscopic-add on     Status: Abnormal   Collection Time: 04/01/2015 10:07 PM  Result Value Ref Range   Squamous Epithelial / LPF 6-30 (A) NONE SEEN   WBC, UA 6-30 0 - 5 WBC/hpf   RBC / HPF 0-5 0 - 5 RBC/hpf   Bacteria, UA MANY (A) NONE SEEN    UA 6-30 white blood cells and many bacteria  No results found for: HGBA1C  Estimated Creatinine Clearance: 25.3 mL/min (by C-G formula based on Cr of 1.32).  BNP (last 3 results) No results for input(s): PROBNP in the last 8760 hours.  Other results:  I have pearsonaly reviewed this: ECG REPORT  Rate: 114  Rhythm: a.fib w RVR ST&T Change: non specific mild ST depressions, rate related   There were no vitals filed for this visit.   Cultures:    Component Value Date/Time   SDES URINE, CLEAN CATCH 08/03/2014 2257   SPECREQUEST Normal 08/03/2014 2257   CULT  08/03/2014 2257    Multiple bacterial morphotypes present, none  predominant. Suggest appropriate recollection if clinically indicated. Performed at [facility] 08/04/2014 FINAL 08/03/2014 2257     Radiological Exams on Admission: Dg Chest 2 View  03/29/2015  CLINICAL DATA:  Pitting edema throughout both legs and into the abdomen, arms, and face. History of stage 3 kidney disease. EXAM: CHEST  2 VIEW COMPARISON:  09/30/2012 FINDINGS: Cardiac enlargement. No significant vascular congestion or edema. No focal consolidation in the lungs. Atelectasis or infiltration in the left lung base. Bilateral pleural effusions. No pneumothorax. Calcified and tortuous aorta. Degenerative changes in the spine and shoulders. IMPRESSION: Cardiac enlargement with bilateral pleural effusions. Basilar atelectasis. Electronically Signed   By: Lucienne Capers M.D.   On: 03/19/2015 20:08    Chart has been reviewed  Family not  at  Bedside  Assessment/Plan  82 year old female history of dementia, chronic kidney disease, hypertension, systolic heart failure, presents with acute on chronic heart failure in atrial fibrillation with RVR which is a new diagnosis  Present on Admission:  . Atrial fibrillation with RVR ([place]) - admit to step down on diltiazem drip CHA2DS Vasc score 5, will need to discuss risk and benefits of anticoagulation in this elderly patient with multiple medical problems and chronic anemia with her family. Obtain echo, cycle CE . Anemia in chronic kidney disease  - obtain anemia panel Hemoccult stools would be helpful in making decision regarding anticoagulation  . Chronic kidney disease, stage III (moderate) chronic continue to monitor continue to monitor creatinine in the setting of diuresis  . Acute on chronic systolic heart failure, NYHA class 2 ([place]) - - admit on telemetry, cycle cardiac enzymes, obtain serial ECG, to evaluate for ischemia as a cause of heart failure  monitor daily weight  diurese with IV lasix and monitor  orthostatics and creatinine to avoid over diuresis.  Order echogram to evaluate EF and valves Not on ACE/ARBi due to chronic kidney disease Consider cardiology consult if no improvement . Dementia with behavioral disturbance chronic continue home medications expect some degree of sundowning  . Hypertension chronic continue home meds     Prophylaxis:   Lovenox   CODE STATUS:   DNR/DNI as per paperwork  Disposition:                            Back to current facility when stable                          Other plan as per orders.  I have spent a total of  58 min on this admission  Oaklie Durrett 04/12/2015, 1:36 AM    [facility]  Pager [phone]   after 2 AM please page floor coverage PA If 7AM-7PM, please contact the day team taking care of the patient  [email or web address]  Password [email or web address]

## 2015-04-12 ENCOUNTER — Ambulatory Visit (HOSPITAL_COMMUNITY): Payer: Commercial Managed Care - HMO

## 2015-04-12 DIAGNOSIS — N183 Chronic kidney disease, stage 3 (moderate): Secondary | ICD-10-CM

## 2015-04-12 DIAGNOSIS — I5033 Acute on chronic diastolic (congestive) heart failure: Secondary | ICD-10-CM

## 2015-04-12 DIAGNOSIS — I509 Heart failure, unspecified: Secondary | ICD-10-CM

## 2015-04-12 DIAGNOSIS — I4891 Unspecified atrial fibrillation: Principal | ICD-10-CM

## 2015-04-12 DIAGNOSIS — E876 Hypokalemia: Secondary | ICD-10-CM

## 2015-04-12 LAB — BASIC METABOLIC PANEL
Anion gap: 12 (ref 5–15)
BUN: 51 mg/dL — ABNORMAL HIGH (ref 6–20)
CHLORIDE: 108 mmol/L (ref 101–111)
CO2: 21 mmol/L — ABNORMAL LOW (ref 22–32)
CREATININE: 1.25 mg/dL — AB (ref 0.44–1.00)
Calcium: 8.8 mg/dL — ABNORMAL LOW (ref 8.9–10.3)
GFR, EST AFRICAN AMERICAN: 42 mL/min — AB (ref >60)
GFR, EST NON AFRICAN AMERICAN: 36 mL/min — AB (ref >60)
Glucose, Bld: 123 mg/dL — ABNORMAL HIGH (ref 65–99)
Potassium: 3.2 mmol/L — ABNORMAL LOW (ref 3.5–5.1)
SODIUM: 141 mmol/L (ref 135–145)

## 2015-04-12 LAB — IRON AND TIBC
Iron: 29 ug/dL (ref 28–170)
SATURATION RATIOS: 17 % (ref 10.4–31.8)
TIBC: 168 ug/dL — ABNORMAL LOW (ref 250–450)
UIBC: 139 ug/dL

## 2015-04-12 LAB — TROPONIN I
Troponin I: 0.06 ng/mL — ABNORMAL HIGH (ref <0.031)
Troponin I: 0.05 ng/mL — ABNORMAL HIGH (ref <0.031)

## 2015-04-12 LAB — RETICULOCYTES
RBC.: 3.62 MIL/uL — AB (ref 3.87–5.11)
RETIC COUNT ABSOLUTE: 79.6 10*3/uL (ref 19.0–186.0)
Retic Ct Pct: 2.2 % (ref 0.4–3.1)

## 2015-04-12 LAB — TSH: TSH: 3.573 u[IU]/mL (ref 0.350–4.500)

## 2015-04-12 LAB — MRSA PCR SCREENING: MRSA BY PCR: POSITIVE — AB

## 2015-04-12 LAB — FERRITIN: Ferritin: 259 ng/mL (ref 11–307)

## 2015-04-12 LAB — VITAMIN B12: VITAMIN B 12: 1050 pg/mL — AB (ref 180–914)

## 2015-04-12 LAB — FOLATE: FOLATE: 23.3 ng/mL (ref >5.9)

## 2015-04-12 LAB — LACTIC ACID, PLASMA: LACTIC ACID, VENOUS: 1.4 mmol/L (ref 0.5–2.0)

## 2015-04-12 MED ORDER — SODIUM CHLORIDE 0.9 % IJ SOLN
3.0000 mL | Freq: Two times a day (BID) | INTRAMUSCULAR | Status: DC
  Administered 2015-04-12 – 2015-04-13 (×5): 3 mL via INTRAVENOUS

## 2015-04-12 MED ORDER — SODIUM CHLORIDE 0.9% FLUSH
3.0000 mL | Freq: Two times a day (BID) | INTRAVENOUS | Status: DC
  Filled 2015-04-12 (×3): qty 3

## 2015-04-12 MED ORDER — ALPRAZOLAM 0.5 MG PO TABS
0.5000 mg | ORAL_TABLET | Freq: Two times a day (BID) | ORAL | Status: DC | PRN
  Administered 2015-04-12 – 2015-04-14 (×2): 0.5 mg via ORAL
  Filled 2015-04-12 (×3): qty 1

## 2015-04-12 MED ORDER — RESOURCE THICKENUP CLEAR PO POWD
ORAL | Status: DC | PRN
  Filled 2015-04-12 (×2): qty 125

## 2015-04-12 MED ORDER — SODIUM CHLORIDE 0.9 % IV SOLN: 250.0000 mL | INTRAVENOUS | Status: DC | PRN

## 2015-04-12 MED ORDER — POLYETHYLENE GLYCOL 3350 17 G PO PACK: 17.0000 g | PACK | Freq: Every day | ORAL | Status: DC | PRN

## 2015-04-12 MED ORDER — ACETAMINOPHEN 325 MG PO TABS
650.0000 mg | ORAL_TABLET | ORAL | Status: DC | PRN
  Administered 2015-04-12 (×2): 650 mg via ORAL
  Filled 2015-04-12 (×2): qty 2

## 2015-04-12 MED ORDER — DILTIAZEM HCL 100 MG IV SOLR
5.0000 mg/h | INTRAVENOUS | Status: DC
  Administered 2015-04-12 (×3): 10 mg/h via INTRAVENOUS
  Filled 2015-04-12 (×2): qty 100

## 2015-04-12 MED ORDER — SODIUM CHLORIDE 0.9% FLUSH
3.0000 mL | INTRAVENOUS | Status: DC | PRN
  Filled 2015-04-12: qty 3

## 2015-04-12 MED ORDER — CHLORHEXIDINE GLUCONATE CLOTH 2 % EX PADS
6.0000 | MEDICATED_PAD | Freq: Every day | CUTANEOUS | Status: DC
  Administered 2015-04-13 – 2015-04-14 (×2): 6 via TOPICAL

## 2015-04-12 MED ORDER — ONDANSETRON HCL 4 MG/2ML IJ SOLN
4.0000 mg | Freq: Four times a day (QID) | INTRAMUSCULAR | Status: DC | PRN
  Administered 2015-04-13 – 2015-04-14 (×3): 4 mg via INTRAVENOUS
  Filled 2015-04-12 (×3): qty 2

## 2015-04-12 MED ORDER — FUROSEMIDE 10 MG/ML IJ SOLN
40.0000 mg | Freq: Two times a day (BID) | INTRAMUSCULAR | Status: DC
  Administered 2015-04-12 – 2015-04-13 (×2): 40 mg via INTRAVENOUS
  Filled 2015-04-12 (×3): qty 4

## 2015-04-12 MED ORDER — ENOXAPARIN SODIUM 30 MG/0.3ML ~~LOC~~ SOLN
30.0000 mg | SUBCUTANEOUS | Status: DC
  Administered 2015-04-12 – 2015-04-13 (×2): 30 mg via SUBCUTANEOUS
  Filled 2015-04-12 (×2): qty 0.3

## 2015-04-12 MED ORDER — SODIUM CHLORIDE 0.9% FLUSH
3.0000 mL | Freq: Two times a day (BID) | INTRAVENOUS | Status: DC
  Administered 2015-04-12 – 2015-04-13 (×3): 3 mL via INTRAVENOUS
  Filled 2015-04-12 (×3): qty 3

## 2015-04-12 MED ORDER — MUPIROCIN 2 % EX OINT
1.0000 "application " | TOPICAL_OINTMENT | Freq: Two times a day (BID) | CUTANEOUS | Status: DC
  Administered 2015-04-12 – 2015-04-13 (×4): 1 via NASAL
  Filled 2015-04-12: qty 22

## 2015-04-12 MED ORDER — SODIUM CHLORIDE 0.9 % IJ SOLN: 3.0000 mL | INTRAMUSCULAR | Status: DC | PRN

## 2015-04-12 MED ORDER — LAMOTRIGINE 25 MG PO TABS
50.0000 mg | ORAL_TABLET | Freq: Every day | ORAL | Status: DC
  Administered 2015-04-12 – 2015-04-13 (×2): 50 mg via ORAL
  Filled 2015-04-12 (×3): qty 2

## 2015-04-12 MED ORDER — POTASSIUM CHLORIDE CRYS ER 20 MEQ PO TBCR
30.0000 meq | EXTENDED_RELEASE_TABLET | ORAL | Status: AC
  Administered 2015-04-12 (×2): 30 meq via ORAL
  Filled 2015-04-12 (×2): qty 1

## 2015-04-12 MED ORDER — SERTRALINE HCL 50 MG PO TABS
50.0000 mg | ORAL_TABLET | Freq: Every day | ORAL | Status: DC
  Administered 2015-04-12 – 2015-04-13 (×2): 50 mg via ORAL
  Filled 2015-04-12 (×2): qty 1

## 2015-04-12 MED ORDER — LEVOTHYROXINE SODIUM 50 MCG PO TABS
50.0000 ug | ORAL_TABLET | Freq: Every day | ORAL | Status: DC
  Administered 2015-04-12 – 2015-04-14 (×3): 50 ug via ORAL
  Filled 2015-04-12 (×3): qty 1

## 2015-04-12 MED ORDER — RISPERIDONE 0.5 MG PO TABS
0.5000 mg | ORAL_TABLET | Freq: Every day | ORAL | Status: DC
  Administered 2015-04-12 – 2015-04-13 (×2): 0.5 mg via ORAL
  Filled 2015-04-12 (×3): qty 1

## 2015-04-12 MED ORDER — TIZANIDINE HCL 2 MG PO TABS
2.0000 mg | ORAL_TABLET | Freq: Every day | ORAL | Status: DC | PRN
  Filled 2015-04-12: qty 1

## 2015-04-12 MED ORDER — PERFLUTREN LIPID MICROSPHERE
INTRAVENOUS | Status: AC
  Administered 2015-04-12: 2 mL
  Filled 2015-04-12: qty 10

## 2015-04-12 MED ORDER — PERFLUTREN LIPID MICROSPHERE
1.0000 mL | INTRAVENOUS | Status: AC | PRN
  Filled 2015-04-12: qty 10

## 2015-04-12 MED ORDER — METOPROLOL TARTRATE 12.5 MG HALF TABLET
12.5000 mg | ORAL_TABLET | Freq: Two times a day (BID) | ORAL | Status: DC
  Administered 2015-04-12 – 2015-04-13 (×3): 12.5 mg via ORAL
  Filled 2015-04-12 (×3): qty 1

## 2015-04-12 NOTE — Consult Note (Addendum)
WOC wound consult note Reason for Consult: Consult requested for multiple skin tears and sacrum pressure injury. Wound type: Sacrum with stage 3 pressure injury; 1.5X.75X.2cm, red and moist, mod amt yellow drainage, no odor. Multiple areas of partial thickness skin tears to the following locations: Left posterior leg 5X2.5X.1cm, red and moist Right inner leg 2X2X.1cm, red and moist Right calf 1.2X1.2X.1cm, red and moist Right posterior calf .5X.5X.1cm and .3X.3X.1cm Right upper leg 4X1X.1cm, skin approximated over 50% of wound Right outer hip with several pink moist round puncture-like wounds, affected area 3X3X.1cm Right upper chest with stage 1 pressure injury; 2X5cm Right arm 6.5X1X.1cm, skin approximated over 60% of wound.  All skin tears with small amt red drainage, no odor. Bilat legs with generalized edema and weeping mod amt yellow drainage. Pressure Ulcer POA: Yes/No Dressing procedure/placement/frequency: Aquacel to absorb drainage and provide antimcrobial benefits to sacrum.  Foam dressings to skin tears to promote healing, Vaseline gauze to areas which require moist healing. No family members present ot discuss plan of care. Please re-consult if further assistance is needed.  Thank-you,  Cammie Mcgee MSN, RN, CWOCN, [place], CNS [phone]

## 2015-04-12 NOTE — Progress Notes (Signed)
Addendum  RN reported hypotension with blood pressure of 85/55 mmHg, but asymptomatic. Apparently has been rate controlled in the 80s for the last 3-4 hours. Recommended discontinuing Cardizem drip. Hold this evening's dose of IV Lasix. Monitor closely.  Marcellus Scott, MD, FACP, FHM. [facility] Pager [phone]  If 7PM-7AM, please contact night-coverage [email or web address] Password [email or web address] 04/12/2015, 7:25 PM

## 2015-04-12 NOTE — Progress Notes (Signed)
Echocardiogram 2D Echocardiogram with Definity has been performed.  Isabel Chaney 04/12/2015, 4:26 PM

## 2015-04-12 NOTE — Progress Notes (Signed)
PROGRESS NOTE    Isabel Chaney ZOX:[id number] DOB: Nov 03, 1919 DOA: 22-Apr-2015 PCP: Oneal Grout, MD  HPI/Brief narrative 82 year old female patient, nursing home resident, nonambulatory at baseline, PMH of hypothyroid, anemia, anxiety, depression, chronic diastolic CHF, stage III chronic kidney disease, HTN, presented to ED from NH with complaints of lower extremity edema, worsening dyspnea and orthopnea. She was seen by nephrology as outpatient and sent to the ED for evaluation. In the ED found to be in new onset A. fib with RVR up to 120s. She was started on Cardizem drip and admitted to stepdown unit for further management. Cardiology consulted.   Assessment/Plan:   1. New onset A. fib with RVR: Started on IV Cardizem drip and admitted to stepdown unit. Cardiology consultation appreciated. Starting metoprolol 12.5 MG BID for rate control. CHADS-VASc 6, however the risk of bleeding outweighs the risk of stroke, hence cardiology recommends ASA 325 mg po daily only for secondary stroke prophylaxis and I agree. Now controlled ventricular rate. 2-D echo 10/10/14: Normal LV function, moderate LVH, moderate to severe aortic stenosis and mild pulmonary hypertension. 2. Acute on chronic diastolic CHF: Continue IV Lasix. Improving. 3. Hypokalemia: Replace and follow. 4. Essential hypertension: Controlled. 5. Elevated troponin: Likely secondary to demand ischemia from A. fib with RVR. As per cardiology, no ischemic workup. 6. Hypothyroid: Continue Synthroid. 7. Multiple wounds: WOC nurse consulted. Await recommendations. 8. Stage III chronic kidney disease: Creatinine stable in the 1.2-1.3 range. 9. Chronic anemia: Follow CBCs. 10. Anxiety & depression: Continue home medications i.e risperidone and sertraline. 11. Moderate to severe aortic stenosis: As per 2-D echo July 2016. Not a surgical candidate. 12. DO NOT RESUSCITATE 13. Hard of hearing    DVT prophylaxis: Lovenox Code Status: DO NOT  RESUSCITATE Family Communication: None at bedside Disposition Plan: DC to SNF when medically stable.   Consultants:  Cardiology  Procedures:  Foley catheter  Antimicrobials:  None   Subjective: States that she feels better. Dyspnea improved. As per RN, no acute events.  Objective: Filed Vitals:   04/12/15 0700 04/12/15 0800 04/12/15 0815 04/12/15 1200  BP: 110/71 114/68 109/61 100/68  Pulse: 101 103 100 91  Temp:   98.6 F (37 C) 98.3 F (36.8 C)  TempSrc:   Oral Oral  Resp: 34  Weight:      SpO2: 98% 98% 98% 99%    Intake/Output Summary (Last 24 hours) at 04/12/15 1227 Last data filed at 04/12/15 0900  Gross per 24 hour  Intake 311.91 ml  Output      0 ml  Net 311.91 ml   Filed Weights   04/12/15 0100  Weight: 71.6 kg (157 lb 13.6 oz)    Exam:  General exam: Pleasant elderly female, lying comfortably propped up in bed. Respiratory system: Slightly diminished breath sounds in the bases with occasional basal crackles. Otherwise clear to auscultation. No increased work of breathing. Cardiovascular system: S1 & S2 heard, RRR. No JVD, murmurs, gallops, clicks or pedal edema. Telemetry: A. fib with controlled ventricular rate in the 80s-90s. Gastrointestinal system: Abdomen is nondistended, soft and nontender. Normal bowel sounds heard. Central nervous system: Alert and oriented. No focal neurological deficits. Extremely hard of hearing. Extremities: Symmetric 5 x 5 power. Extensive bruising of all extremities, multiple dressings on lower extremity wounds and apparently has a sacral stage III decubitus per nursing. Management per WOC   Data Reviewed: Basic Metabolic Panel:  Recent Labs Lab 04/22/15 1948 04/12/15 0902  NA 141 141  K 3.3* 3.2*  CL 107 108  CO2 22 21*  GLUCOSE 151* 123*  BUN 58* 51*  CREATININE 1.32* 1.25*  CALCIUM 8.9 8.8*   Liver Function Tests: No results for input(s): AST, ALT, ALKPHOS, BILITOT, PROT, ALBUMIN in the last  168 hours. No results for input(s): LIPASE, AMYLASE in the last 168 hours. No results for input(s): AMMONIA in the last 168 hours. CBC:  Recent Labs Lab 04/13/2015 1948  WBC 5.5  HGB 10.6*  HCT 34.1*  MCV 96.3  PLT 153   Cardiac Enzymes:  Recent Labs Lab 04/12/15 0902  TROPONINI 0.06*   BNP (last 3 results) No results for input(s): PROBNP in the last 8760 hours. CBG: No results for input(s): GLUCAP in the last 168 hours.  Recent Results (from the past 240 hour(s))  Culture, blood (routine x 2)     Status: None (Preliminary result)   Collection Time: 03/20/2015  7:48 PM  Result Value Ref Range Status   Specimen Description BLOOD LEFT ANTECUBITAL  Final   Special Requests BOTTLES DRAWN AEROBIC AND ANAEROBIC 5CC  Final   Culture NO GROWTH < 24 HOURS  Final   Report Status PENDING  Incomplete  Culture, blood (routine x 2)     Status: None (Preliminary result)   Collection Time: 04/01/2015  8:24 PM  Result Value Ref Range Status   Specimen Description BLOOD LEFT FOREARM  Final   Special Requests BOTTLES DRAWN AEROBIC AND ANAEROBIC 5CC  Final   Culture NO GROWTH < 24 HOURS  Final   Report Status PENDING  Incomplete  MRSA PCR Screening     Status: Abnormal   Collection Time: 04/12/15  1:31 AM  Result Value Ref Range Status   MRSA by PCR POSITIVE (A) NEGATIVE Final    Comment:        The GeneXpert MRSA Assay (FDA approved for NASAL specimens only), is one component of a comprehensive MRSA colonization surveillance program. It is not intended to diagnose MRSA infection nor to guide or monitor treatment for MRSA infections. RESULT CALLED TO, READ BACK BY AND VERIFIED WITH: C DEHM  04/12/15 MKELLY          Studies: Dg Chest 2 View  04/05/2015  CLINICAL DATA:  Pitting edema throughout both legs and into the abdomen, arms, and face. History of stage 3 kidney disease. EXAM: CHEST  2 VIEW COMPARISON:  09/30/2012 FINDINGS: Cardiac enlargement. No significant vascular  congestion or edema. No focal consolidation in the lungs. Atelectasis or infiltration in the left lung base. Bilateral pleural effusions. No pneumothorax. Calcified and tortuous aorta. Degenerative changes in the spine and shoulders. IMPRESSION: Cardiac enlargement with bilateral pleural effusions. Basilar atelectasis. Electronically Signed   By: Burman Nieves M.D.   On: 03/31/2015 20:08        Scheduled Meds: . enoxaparin (LOVENOX) injection  30 mg Subcutaneous Q24H  . furosemide  40 mg Intravenous BID  . lamoTRIgine  50 mg Oral Daily  . levothyroxine  50 mcg Oral QAC breakfast  . metoprolol tartrate  12.5 mg Oral BID  . risperiDONE  0.5 mg Oral Daily  . sertraline  50 mg Oral Daily  . sodium chloride  3 mL Intravenous Q12H  . sodium chloride flush  3 mL Intravenous Q12H   Continuous Infusions: . diltiazem (CARDIZEM) infusion 10 mg/hr (04/12/15 4098)    Active Problems:   Chronic kidney disease, stage III (moderate)   Hypertension   Anemia in chronic kidney disease   Dementia  with behavioral disturbance   Acute on chronic systolic heart failure, NYHA class 2 ([facility])   Atrial fibrillation with RVR ([facility])   CHF exacerbation ([facility])    Time spent: 30 minutes.    Marcellus Scott, MD, FACP, FHM. [facility] Pager [phone]  If 7PM-7AM, please contact night-coverage [email or web address] Password [facility] 04/12/2015, 12:27 PM    LOS: 1 day

## 2015-04-12 NOTE — Evaluation (Signed)
Clinical/Bedside Swallow Evaluation Patient Details  Name: GENNY CAULDER MRN: [id number] Date of Birth: 12-23-1919  Today's Date: 04/12/2015 Time: SLP Start Time (ACUTE ONLY): 1026 SLP Stop Time (ACUTE ONLY): 1050 SLP Time Calculation (min) (ACUTE ONLY): 24 min  Past Medical History:  Past Medical History  Diagnosis Date  . Hypothyroidism   . Anemia   . Anxiety   . Depression   . Acute on chronic systolic heart failure (HCC)   . Osteoporosis   . CHF (congestive heart failure) (HCC)   . Chronic kidney disease, stage III (moderate)   . Hypertension   . Memory disorder 06/14/2014  . Abnormality of gait 06/14/2014  . Allergic rhinitis   . Recurrent cystitis   . Hypomanic psychosis single episode or unspecified   . Protein calorie malnutrition (HCC)   . Chronic pain   . Dry mouth   . Hypotension due to drugs   . Hypokalemia    Past Surgical History:  Past Surgical History  Procedure Laterality Date  . Cataract extraction Bilateral   . Abdominal hysterectomy     HPI:  Pt is 82 y.o. female with h/o acute on chronic systolic heart failure, CHF, chronic kidney disease stage III, hypertension, memory disorder, hypomanic psychosis single episode or unspecified, chronic pain, dry mouth and hypotension due to drugs. Pt presented to ED with new onset atrial fibrillation, lower extremity edema and SOB. CXR Cardiac enlargement with bilateral pleural effusions. Basilar atelectasis. Previous MBSS 12/22/2014 and 07/29/2014 showed mild oropharyngeal and cervical esophageal dysphagia that is sensorimotor based and put on Dysphagia 3 diet and nectar thick liquids.   Assessment / Plan / Recommendation Clinical Impression  SLP provided POs to pt at bedside to assess swallow function. Pt has been on Dysphagia 3 diet and nectar thick liquids at nursing home prior to admission. Prolonged mastication during intake of solids. No pharyngeal deficits observed during intake of nectar thick liquids and puree. D/w  daughter history of dysphagia; she reported pt to have chronic dysphagia and agreed that repeat objective assessment not warranted at this time for possible upgrade. Pt and daughter were educated re: diet recommendation and verbalized understanding. Recommend nectar thick liquids and Dysphagia 3 diet. No SLP f/u necessary at this time.    Aspiration Risk  Mild aspiration risk (mild-mod)    Diet Recommendation Dysphagia 3 (Mech soft);Nectar-thick liquid   Liquid Administration via: Cup;No straw Medication Administration: Whole meds with puree Supervision: Patient able to self feed;Intermittent supervision to cue for compensatory strategies;Staff to assist with self feeding Compensations: Slow rate;Small sips/bites;Minimize environmental distractions Postural Changes: Seated upright at 90 degrees    Other  Recommendations Oral Care Recommendations: Oral care BID             Prognosis Prognosis for Safe Diet Advancement: Good      Swallow Study   General HPI: Pt is 82 y.o. female with h/o acute on chronic systolic heart failure, CHF, chronic kidney disease stage III, hypertension, memory disorder, hypomanic psychosis single episode or unspecified, chronic pain, dry mouth and hypotension due to drugs. Pt presented to ED with new onset atrial fibrillation, lower extremity edema and SOB. CXR Cardiac enlargement with bilateral pleural effusions. Basilar atelectasis. Previous MBSS 12/22/2014 and 07/29/2014 showed mild oropharyngeal and cervical esophageal dysphagia that is sensorimotor based and put on Dysphagia 3 diet and nectar thick liquids. Type of Study: Bedside Swallow Evaluation Previous Swallow Assessment: May and October 2016 Diet Prior to this Study: Regular Temperature Spikes Noted: No Respiratory  Status: Nasal cannula History of Recent Intubation: No Behavior/Cognition: Alert;Cooperative;Pleasant mood Oral Cavity Assessment: Within Functional Limits Oral Care Completed by SLP:  No Oral Cavity - Dentition: Adequate natural dentition Vision: Functional for self-feeding Self-Feeding Abilities: Able to feed self Patient Positioning: Upright in bed Baseline Vocal Quality: Normal;Other (comment) (harsh and strained at end of exhalation, but appears baselin) Volitional Cough: Strong Volitional Swallow: Able to elicit    Oral/Motor/Sensory Function Overall Oral Motor/Sensory Function: Within functional limits   Ice Chips Ice chips: Not tested   Thin Liquid Thin Liquid: Not tested    Nectar Thick Nectar Thick Liquid: Within functional limits   Honey Thick Honey Thick Liquid: Not tested   Puree Puree: Within functional limits   Solid   GO   Solid: Impaired Presentation: Self Fed Oral Phase Impairments: Impaired mastication Oral Phase Functional Implications: Impaired mastication Pharyngeal Phase Impairments: Other (comments) (none found)        Doye Montilla 04/12/2015,2:12 PM  Lynita Lombard, Student-SLP

## 2015-04-12 NOTE — Care Management Note (Signed)
Case Management Note  Patient Details  Name: Isabel Chaney MRN: [id number] Date of Birth: [birth year]  Subjective/Objective:      Adm w at fib w rvr              Action/Plan: from [facility], [facility] ref made   Expected Discharge Date:                  Expected Discharge Plan:  Skilled Nursing Facility  In-House Referral:  Clinical Social Work  Discharge planning Services     Post Acute Care Choice:    Choice offered to:     DME Arranged:    DME Agency:     HH Arranged:    HH Agency:     Status of Service:     Medicare Important Message Given:    Date Medicare IM Given:    Medicare IM give by:    Date Additional Medicare IM Given:    Additional Medicare Important Message give by:     If discussed at Long Length of Stay Meetings, dates discussed:    Additional Comments: ur review done  Hanley Hays, RN 04/12/2015, 8:08 AM

## 2015-04-12 NOTE — Consult Note (Addendum)
CARDIOLOGY CONSULT NOTE   Patient ID: Isabel Chaney MRN: [id number], DOB/AGE: 80-Dec-1921   Admit date: 04/19/15 Date of Consult: 04/12/2015  Primary Physician: Oneal Grout, MD Primary Cardiologist: none  Reason for consult:  A-fib with RVR  Problem List  Past Medical History  Diagnosis Date  . Hypothyroidism   . Anemia   . Anxiety   . Depression   . Acute on chronic systolic heart failure (HCC)   . Osteoporosis   . CHF (congestive heart failure) (HCC)   . Chronic kidney disease, stage III (moderate)   . Hypertension   . Memory disorder 06/14/2014  . Abnormality of gait 06/14/2014  . Allergic rhinitis   . Recurrent cystitis   . Hypomanic psychosis single episode or unspecified   . Protein calorie malnutrition (HCC)   . Chronic pain   . Dry mouth   . Hypotension due to drugs   . Hypokalemia     Past Surgical History  Procedure Laterality Date  . Cataract extraction Bilateral   . Abdominal hysterectomy       Allergies  Allergies  Allergen Reactions  . Atorvastatin     Other reaction(s): MYALGIA  . Penicillins     Per MAR  . Simvastatin     Other reaction(s): HEADACHE    HPI   Isabel Chaney is a 82 y.o. female with h/o hypothyroidism; Anemia; Anxiety; Depression; Acute on chronic systolic heart failure,  Chronic kidney disease, stage III (moderate); Hypertension; dementia. She was brought to the ER from [facility]. She's been having some lower extremity edema for some time and the staff has been wrapping her legs. She has known history of stage III kidney disease and was seen by nephrology who examined her and was worried for fluid overload. Patient was sent to emerge department she was found to have a new onset atrial fibrillation with RVR heart rates up to 120s. In ER: Patient was put on 12 L of nasal cannula and oxygen saturation was noted to be 97. She was started on Cardizem drip. Potassium noted to be 3.3 lactic acid 2.0 chest x-ray showing cardiac  enlargement and bilateral pleural effusions. The patient lives in a nursing home and has been bedridden for the last 3 years. She has noticed worsening SOB in the last 2 weeks. Also positive orthopnea, PND and LE edema.   Inpatient Medications  . enoxaparin (LOVENOX) injection  30 mg Subcutaneous Q24H  . furosemide  40 mg Intravenous BID  . lamoTRIgine  50 mg Oral Daily  . levothyroxine  50 mcg Oral QAC breakfast  . risperiDONE  0.5 mg Oral Daily  . sertraline  50 mg Oral Daily  . sodium chloride  3 mL Intravenous Q12H  . sodium chloride flush  3 mL Intravenous Q12H    Family History Family History  Problem Relation Age of Onset  . Heart attack Mother   . Hypertension Mother   . Heart attack Father   . Hypertension Father   . Heart disease Sister   . Heart disease Brother   . Heart attack Brother   . Heart disease Sister   . Dementia Sister   . Heart disease Brother   . Heart attack Brother   . Stroke Brother      Social History Social History   Social History  . Marital Status: Widowed    Spouse Name: N/A  . Number of Children: 2  . Years of Education: 12   Occupational History  .  retired    Social History Main Topics  . Smoking status: Former Smoker    Quit date: 03/18/1964  . Smokeless tobacco: Never Used  . Alcohol Use: No  . Drug Use: No  . Sexual Activity: Not on file   Other Topics Concern  . Not on file   Social History Narrative   Patient is right handed.   Patient drinks 2 cups of caffeine daily.     Review of Systems  General:  No chills, fever, night sweats or weight changes.  Cardiovascular:  No chest pain, dyspnea on exertion, edema, orthopnea, palpitations, paroxysmal nocturnal dyspnea. Dermatological: No rash, lesions/masses Respiratory: No cough, dyspnea Urologic: No hematuria, dysuria Abdominal:   No nausea, vomiting, diarrhea, bright red blood per rectum, melena, or hematemesis Neurologic:  No visual changes, wkns, changes in  mental status. All other systems reviewed and are otherwise negative except as noted above.  Physical Exam  Blood pressure 109/61, pulse 100, temperature 98.6 F (37 C), temperature source Oral, resp. rate 19, weight 157 lb 13.6 oz (71.6 kg), SpO2 98 %.  General: Pleasant, NAD Psych: Normal affect. Neuro: Alert and oriented X 3. Moves all extremities spontaneously. HEENT: Normal  Neck: Supple without bruits , elevated JVD. Lungs:  Resp regular and unlabored, bilateral crackles. Heart: iRRR no s3, s4, 3/6 holosystolic murmur. Abdomen: Soft, non-tender, non-distended, BS + x 4.  Extremities: No clubbing, cyanosis, B/L LE edema. DP/PT/Radials 2+ and equal bilaterally.  Labs  Recent Labs  04/12/15 0902  TROPONINI 0.06*   Lab Results  Component Value Date   WBC 5.5 04/16/2015   HGB 10.6* 03/22/2015   HCT 34.1* 04/10/2015   MCV 96.3 04/10/2015   PLT 153 04/13/2015    Recent Labs Lab 04/12/15 0902  NA 141  K 3.2*  CL 108  CO2 21*  BUN 51*  CREATININE 1.25*  CALCIUM 8.8*  GLUCOSE 123*   Radiology/Studies  Dg Chest 2 View  04/13/2015  CLINICAL DATA:  Pitting edema throughout both legs and into the abdomen, arms, and face. History of stage 3 kidney disease. EXAM: CHEST  2 VIEW COMPARISON:  09/30/2012 FINDINGS: Cardiac enlargement. No significant vascular congestion or edema. No focal consolidation in the lungs. Atelectasis or infiltration in the left lung base. Bilateral pleural effusions. No pneumothorax. Calcified and tortuous aorta. Degenerative changes in the spine and shoulders. IMPRESSION: Cardiac enlargement with bilateral pleural effusions. Basilar atelectasis. Electronically Signed   By: Burman Nieves M.D.   On: 03/30/2015 20:08   Echocardiogram : scan from 2016 Normal LVEF, moderate LVH Moderate aortic stenosis Mild pulmonary hypertension   ECG: a-fib with RVR Previously  NSR in 07/2014     ASSESSMENT AND PLAN  1. New diagnosis of a-fib with RVR,  -  CHADS-VASc 6, however the risk of bleeding outweighs the risk of stroke, I would suggest use of ASA 325 mg po daily only - start metoprolol 12.5 mg po BID for rate control  2. Acute on chronic diastolic CHF - continue iv Lasix 40 mg BID, we will follow, crea 1.25, replace potassium aggessively  3. Hypertension - controlled  4. Hypokalemia - replace   5. Demand ischemia - sec to a-fib with RVR, no ischemic work up indicated  Signed, Lars Masson, MD, [facility] 04/12/2015, 10:20 AM

## 2015-04-13 ENCOUNTER — Inpatient Hospital Stay (HOSPITAL_COMMUNITY): Payer: Commercial Managed Care - HMO

## 2015-04-13 DIAGNOSIS — R600 Localized edema: Secondary | ICD-10-CM

## 2015-04-13 DIAGNOSIS — R627 Adult failure to thrive: Secondary | ICD-10-CM | POA: Insufficient documentation

## 2015-04-13 DIAGNOSIS — I10 Essential (primary) hypertension: Secondary | ICD-10-CM

## 2015-04-13 DIAGNOSIS — D631 Anemia in chronic kidney disease: Secondary | ICD-10-CM

## 2015-04-13 DIAGNOSIS — I4891 Unspecified atrial fibrillation: Secondary | ICD-10-CM | POA: Insufficient documentation

## 2015-04-13 DIAGNOSIS — I5023 Acute on chronic systolic (congestive) heart failure: Secondary | ICD-10-CM

## 2015-04-13 DIAGNOSIS — N189 Chronic kidney disease, unspecified: Secondary | ICD-10-CM

## 2015-04-13 LAB — CBC
HCT: 34.9 % — ABNORMAL LOW (ref 36.0–46.0)
Hemoglobin: 10.7 g/dL — ABNORMAL LOW (ref 12.0–15.0)
MCH: 30 pg (ref 26.0–34.0)
MCHC: 30.7 g/dL (ref 30.0–36.0)
MCV: 97.8 fL (ref 78.0–100.0)
PLATELETS: 155 10*3/uL (ref 150–400)
RBC: 3.57 MIL/uL — ABNORMAL LOW (ref 3.87–5.11)
RDW: 17.6 % — AB (ref 11.5–15.5)
WBC: 5.4 10*3/uL (ref 4.0–10.5)

## 2015-04-13 LAB — BASIC METABOLIC PANEL
Anion gap: 11 (ref 5–15)
BUN: 56 mg/dL — AB (ref 6–20)
CALCIUM: 8.8 mg/dL — AB (ref 8.9–10.3)
CO2: 22 mmol/L (ref 22–32)
CREATININE: 1.38 mg/dL — AB (ref 0.44–1.00)
Chloride: 108 mmol/L (ref 101–111)
GFR calc Af Amer: 37 mL/min — ABNORMAL LOW (ref >60)
GFR, EST NON AFRICAN AMERICAN: 32 mL/min — AB (ref >60)
GLUCOSE: 98 mg/dL (ref 65–99)
POTASSIUM: 4.8 mmol/L (ref 3.5–5.1)
SODIUM: 141 mmol/L (ref 135–145)

## 2015-04-13 MED ORDER — ASPIRIN EC 325 MG PO TBEC
325.0000 mg | DELAYED_RELEASE_TABLET | Freq: Every day | ORAL | Status: DC
  Administered 2015-04-13: 325 mg via ORAL
  Filled 2015-04-13: qty 1

## 2015-04-13 MED ORDER — FUROSEMIDE 40 MG PO TABS
40.0000 mg | ORAL_TABLET | Freq: Every day | ORAL | Status: DC
  Administered 2015-04-13: 40 mg via ORAL
  Filled 2015-04-13: qty 1

## 2015-04-13 MED ORDER — MORPHINE SULFATE (CONCENTRATE) 10 MG/0.5ML PO SOLN
5.0000 mg | ORAL | Status: DC | PRN
  Administered 2015-04-13 – 2015-04-14 (×2): 5 mg via ORAL
  Filled 2015-04-13 (×2): qty 0.5

## 2015-04-13 MED ORDER — LORAZEPAM 2 MG/ML IJ SOLN
0.5000 mg | Freq: Once | INTRAMUSCULAR | Status: AC
  Administered 2015-04-13: 0.5 mg via INTRAVENOUS
  Filled 2015-04-13: qty 1

## 2015-04-13 MED ORDER — OXYCODONE-ACETAMINOPHEN 5-325 MG PO TABS
2.0000 | ORAL_TABLET | Freq: Once | ORAL | Status: AC
Start: 2015-04-13 — End: 2015-04-13
  Administered 2015-04-13: 2 via ORAL
  Filled 2015-04-13: qty 2

## 2015-04-13 MED ORDER — AMIODARONE HCL 200 MG PO TABS
400.0000 mg | ORAL_TABLET | Freq: Two times a day (BID) | ORAL | Status: DC
  Administered 2015-04-13: 400 mg via ORAL
  Filled 2015-04-13: qty 2

## 2015-04-13 MED ORDER — DIGOXIN 125 MCG PO TABS
0.1250 mg | ORAL_TABLET | Freq: Every day | ORAL | Status: DC
  Administered 2015-04-13: 0.125 mg via ORAL
  Filled 2015-04-13: qty 1

## 2015-04-13 MED ORDER — AMIODARONE HCL IN DEXTROSE 360-4.14 MG/200ML-% IV SOLN
30.0000 mg/h | INTRAVENOUS | Status: DC
  Administered 2015-04-14: 30 mg/h via INTRAVENOUS
  Filled 2015-04-13: qty 200

## 2015-04-13 MED ORDER — ADULT MULTIVITAMIN W/MINERALS CH
1.0000 | ORAL_TABLET | Freq: Every day | ORAL | Status: DC
  Administered 2015-04-13: 1 via ORAL
  Filled 2015-04-13: qty 1

## 2015-04-13 MED ORDER — AMIODARONE LOAD VIA INFUSION
150.0000 mg | Freq: Once | INTRAVENOUS | Status: AC
  Administered 2015-04-13: 150 mg via INTRAVENOUS
  Filled 2015-04-13: qty 83.34

## 2015-04-13 MED ORDER — AMIODARONE HCL IN DEXTROSE 360-4.14 MG/200ML-% IV SOLN
60.0000 mg/h | INTRAVENOUS | Status: AC
  Administered 2015-04-13 (×2): 60 mg/h via INTRAVENOUS
  Filled 2015-04-13 (×2): qty 200

## 2015-04-13 MED ORDER — SODIUM CHLORIDE 0.9 % IV BOLUS (SEPSIS)
250.0000 mL | Freq: Once | INTRAVENOUS | Status: AC
  Administered 2015-04-13: 250 mL via INTRAVENOUS

## 2015-04-13 MED ORDER — PRO-STAT SUGAR FREE PO LIQD
30.0000 mL | Freq: Two times a day (BID) | ORAL | Status: DC
  Administered 2015-04-13: 30 mL via ORAL
  Filled 2015-04-13: qty 30

## 2015-04-13 NOTE — Progress Notes (Signed)
Called by RN re: atrial fib, RVR  Pt HR currently 120s-140s She is symptomatic and SBP 90s. Amio PO and dig started this am. Cardizem d/c'd 2nd hypotension.  Will change amiodarone to IV for now. Reassess in am, sooner prn.  Leanna Battles 04/13/2015 7:38 PM Beeper [phone]

## 2015-04-13 NOTE — Progress Notes (Signed)
VASCULAR LAB PRELIMINARY  PRELIMINARY  PRELIMINARY  PRELIMINARY  Left lower extremity venous duplex completed.    Preliminary report:  There is no obvious evidence of DVT or SVT noted in the visualized veins of the left lower extremity.  There is significant interstitial fluid noted.  Emigdio Wildeman, RVT 04/13/2015, 2:52 PM

## 2015-04-13 NOTE — Progress Notes (Signed)
PT Cancellation Note  Patient Details Name: KALEY JUTRAS MRN: [id number] DOB: 20-Jun-1919   Cancelled Treatment:    Reason Eval/Treat Not Completed: PT screened, no needs identified, will sign off.  Called [employer] and talked with Archie Patten, supervisor regarding pts prior functional status.  Pt has been bedridden x 3 years per chart and Tonya confirms.  Pt was total care for bathing and dressing.  Nursing staff used a standing lift to get pt OOB to wheelchair daily.  Pt was able to feed herself.  Will speak with nursing in hospital to inform them of pts prior mobility so they can continue same care while here.  Not appropriate for PT.     Tawni Millers F 04/13/2015, 12:52 PM [employer] [facility] [phone] (pager)

## 2015-04-13 NOTE — Progress Notes (Signed)
Initial Nutrition Assessment  DOCUMENTATION CODES:   Not applicable  INTERVENTION:   -Magic Cup TID with meals -MVI daily -30 ml Prostat daily, each supplement provides 100 kcals and 15 grams protein  NUTRITION DIAGNOSIS:   Increased nutrient needs related to wound healing as evidenced by estimated needs.  GOAL:   Patient will meet greater than or equal to 90% of their needs  MONITOR:   PO intake, Supplement acceptance, Labs, Weight trends, Skin, I & O's  REASON FOR ASSESSMENT:   Low Braden    ASSESSMENT:   82 year old female patient, nursing home resident, nonambulatory at baseline, PMH of hypothyroid, anemia, anxiety, depression, chronic diastolic CHF, stage III chronic kidney disease, HTN, presented to ED from NH with complaints of lower extremity edema, worsening dyspnea and orthopnea. She was seen by nephrology as outpatient and sent to the ED for evaluation. In the ED found to be in new onset A. fib with RVR up to 120s. She was started on Cardizem drip and admitted to stepdown unit for further management. Cardiology consulted.  Pt admitted with a-fib with RVR.   Pt underwent BSE by SLP on 04/12/15; recommending dysphagia 3 diet with nectar thick liquids (which was her prescribed diet PTA).   Reviewed [facility] note from 04/12/15; pt with stage III on sacrum and multiple partial thickness wounds (lt posterior leg, rt inner calf, rt posterior calf, rt upper leg, rt outer hip punture wounds, pr tupper chest (stage 1), rt arm).   Spoke with pt at bedside, who is unable to provide hx due to cognitive deficit. She reports she feels well "as long as I don't move my legs". Pt consumed about 25% of her breakfast tray. Noted meal completion 15-50%.   Nutrition-Focused physical exam completed. Findings are mild fat depletion, mild muscle depletion, and mild edema. This RD did not examine lower extremities secondary to pain. Suspect fat and muscle depletion is due to advanced age and  bedbound status.   Reviewed wt hx; wt has been stable for > 1 year.   Pt with increased calorie and protein needs due to pressure injuries; RD to add supplements to optimize nutritional intake.   Labs reviewed.   Diet Order:  DIET DYS 3 Room service appropriate?: Yes; Fluid consistency:: Nectar Thick  Skin:  Wound (see comment) (st IIIsacrum, multiple partial thickness on BUE and BLE)  Last BM:  04/12/15  Height:   Ht Readings from Last 1 Encounters:  05-05-2015  (1.575 m)    Weight:   Wt Readings from Last 1 Encounters:  04/13/15 160 lb 11.5 oz (72.9 kg)    Ideal Body Weight:  50 kg  BMI:  Body mass index is 29.39 kg/(m^2).  Estimated Nutritional Needs:   Kcal:  1550-1750  Protein:  65-80 grams  Fluid:  1.5-1.7 L  EDUCATION NEEDS:   Education needs no appropriate at this time  Molly Savarino A. Mayford Knife, RD, LDN, CDE Pager: [phone] After hours Pager: [phone]

## 2015-04-13 NOTE — Progress Notes (Signed)
At 20:30 pt reported new onset of pain in stomach and dyspnea, PRNs given (see MAR) and md notified. Currently O2 sats 100% on 2L nasal canula and respiratory rate 25/min. Will continue to monitor.   IV ativan administered per MD order, pt now resting more comfortably and no complaints of pain or difficulty breathing.   Stanford Breed, RN 04/13/2015 10:45 PM

## 2015-04-13 NOTE — Progress Notes (Signed)
PROGRESS NOTE    Isabel Chaney:[id number] DOB: 1920/01/16 DOA: 04/02/2015 PCP: Oneal Grout, MD  HPI/Brief narrative 82 year old female patient, nursing home resident, nonambulatory at baseline, PMH of hypothyroid, anemia, anxiety, depression, chronic diastolic CHF, stage III chronic kidney disease, HTN, presented to ED from NH with complaints of lower extremity edema, worsening dyspnea and orthopnea. She was seen by nephrology as outpatient and sent to the ED for evaluation. In the ED found to be in new onset A. fib with RVR up to 120s. She was started on Cardizem drip and admitted to stepdown unit for further management. Cardiology consulted.   Assessment/Plan:   1. New onset A. fib with RVR: Started on IV Cardizem drip and admitted to stepdown unit. Cardiology consultation appreciated. Starting metoprolol 12.5 MG BID for rate control. CHADS-VASc 6, however the risk of bleeding outweighs the risk of stroke, hence cardiology recommends ASA 325 mg po daily only for secondary stroke prophylaxis- started. Repeat 2-D echo results as below: LVEF 20-25 percent. Discussed with cardiology. IV Cardizem discontinued 1/25 due to hypotension. Unable to titrate up beta blocker secondary to soft blood pressures. Starting amiodarone and digoxin orally. Ventricular rate in the 90s-100s this morning. 2. Acute on chronic diastolic CHF: Treated initially with IV Lasix. Creatinine has increased slightly. Transition to oral Lasix. Unable to use ACEI/ARB secondary to hypotension and chronic kidney disease. 3. Hypokalemia: Replaced 4. Essential hypertension/hypotension: Monitor. 5. Elevated troponin: Likely secondary to demand ischemia from A. fib with RVR. As per cardiology, no ischemic workup. 6. Hypothyroid: Continue Synthroid. 7. Multiple wounds: WOC nurse input appreciated. 8. Stage III chronic kidney disease: Creatinine stable in the 1.2-1.3 range. creatinine 1.38 today. Follow 9. Chronic anemia: stable.    10. Anxiety & depression: Continue home medications i.e risperidone and sertraline. 11. Moderate to severe aortic stenosis: As per 2-D echo July 2016. Not a surgical candidate. repeat echo: Moderate AS 12. DO NOT RESUSCITATE 13. Hard of hearing 14. Adult failure to thrive: Multifactorial from advanced age, multiple severe comorbidities, bedbound status for the last 3 years, multiple bedsores. Discussed with patient and her daughter and consulted palliative care team for goals of care. They are agreeable.    DVT prophylaxis: Lovenox Code Status: DO NOT RESUSCITATE Family Communication: discussed with patient's daughter Mrs. Deniece Ree on 04/13/15. Disposition Plan: DC to SNF when medically stable.   Consultants:  Cardiology  Procedures:  Foley catheter-continued due to stage III sacral decubitus ulcer and risk of incontinence and soiling.   2 D Echo 04/12/2015: Study Conclusions  - Left ventricle: The cavity size was mildly dilated. Wall thickness was increased in a pattern of mild LVH. Systolic function was severely reduced. The estimated ejection fraction was in the range of 20% to 25%. - Aortic valve: There was moderate stenosis. There was mild to moderate regurgitation. Valve area (VTI): 0.24 cm^2. Valve area (Vmax): 0.27 cm^2. Valve area (Vmean): 0.25 cm^2. - Mitral valve: Moderately calcified annulus. Mildly thickened leaflets . There was moderate to severe regurgitation. - Left atrium: The atrium was severely dilated. - Right ventricle: The cavity size was mildly dilated. Systolic function was mildly reduced. - Right atrium: The atrium was moderately to severely dilated.  Antimicrobials:  None   Subjective: Denies dyspnea or chest pain. States that she had pain in her sacral area and legs overnight.   Objective: Filed Vitals:   04/13/15 0810 04/13/15 0900 04/13/15 1000 04/13/15 1027  BP: 101/82  Pulse: 106 99 105 118  Temp:       TempSrc:      Resp: Weight:      SpO2: 98% 97% 96%     Intake/Output Summary (Last 24 hours) at 04/13/15 1054 Last data filed at 04/13/15 1000  Gross per 24 hour  Intake    800 ml  Output    620 ml  Net    180 ml   Filed Weights   04/12/15 0100 04/13/15 0600  Weight: 71.6 kg (157 lb 13.6 oz) 72.9 kg (160 lb 11.5 oz)    Exam:  General exam: Pleasant elderly female, lying comfortably propped up in bed. Respiratory system: Slightly diminished breath sounds in the bases with occasional basal crackles. Otherwise clear to auscultation. No increased work of breathing. Cardiovascular system: S1 & S2 heard, RRR. No JVD, murmurs, gallops, clicks or pedal edema. Telemetry: A. fib with controlled ventricular rate in the 90s-100s. Gastrointestinal system: Abdomen is nondistended, soft and nontender. Normal bowel sounds heard. Central nervous system: Alert and oriented. No focal neurological deficits. Extremely hard of hearing. Extremities: Symmetric 5 x 5 power. Extensive bruising of all extremities, multiple dressings on lower extremity wounds and apparently has a sacral stage III decubitus per nursing. Management per WOC   Data Reviewed: Basic Metabolic Panel:  Recent Labs Lab 03/26/2015 1948 04/12/15 0902 04/13/15 0318  NA 141 141 141  K 3.3* 3.2* 4.8  CL 107 108 108  CO2 22 21* 22  GLUCOSE 151* 123* 98  BUN 58* 51* 56*  CREATININE 1.32* 1.25* 1.38*  CALCIUM 8.9 8.8* 8.8*   Liver Function Tests: No results for input(s): AST, ALT, ALKPHOS, BILITOT, PROT, ALBUMIN in the last 168 hours. No results for input(s): LIPASE, AMYLASE in the last 168 hours. No results for input(s): AMMONIA in the last 168 hours. CBC:  Recent Labs Lab 04/07/2015 1948 04/13/15 0318  WBC 5.5 5.4  HGB 10.6* 10.7*  HCT 34.1* 34.9*  MCV 96.3 97.8  PLT 153 155   Cardiac Enzymes:  Recent Labs Lab 04/12/15 0902 04/12/15 1322  TROPONINI 0.06* 0.05*   BNP (last 3 results) No results for  input(s): PROBNP in the last 8760 hours. CBG: No results for input(s): GLUCAP in the last 168 hours.  Recent Results (from the past 240 hour(s))  Culture, blood (routine x 2)     Status: None (Preliminary result)   Collection Time: 03/23/2015  7:48 PM  Result Value Ref Range Status   Specimen Description BLOOD LEFT ANTECUBITAL  Final   Special Requests BOTTLES DRAWN AEROBIC AND ANAEROBIC 5CC  Final   Culture NO GROWTH < 24 HOURS  Final   Report Status PENDING  Incomplete  Culture, blood (routine x 2)     Status: None (Preliminary result)   Collection Time: 04/04/2015  8:24 PM  Result Value Ref Range Status   Specimen Description BLOOD LEFT FOREARM  Final   Special Requests BOTTLES DRAWN AEROBIC AND ANAEROBIC 5CC  Final   Culture NO GROWTH < 24 HOURS  Final   Report Status PENDING  Incomplete  MRSA PCR Screening     Status: Abnormal   Collection Time: 04/12/15  1:31 AM  Result Value Ref Range Status   MRSA by PCR POSITIVE (A) NEGATIVE Final    Comment:        The GeneXpert MRSA Assay (FDA approved for NASAL specimens only), is one component of a comprehensive MRSA colonization surveillance program. It is not intended to diagnose MRSA infection nor  to guide or monitor treatment for MRSA infections. RESULT CALLED TO, READ BACK BY AND VERIFIED WITH: C DEHM  04/12/15 MKELLY          Studies: Dg Chest 2 View  04/10/2015  CLINICAL DATA:  Pitting edema throughout both legs and into the abdomen, arms, and face. History of stage 3 kidney disease. EXAM: CHEST  2 VIEW COMPARISON:  09/30/2012 FINDINGS: Cardiac enlargement. No significant vascular congestion or edema. No focal consolidation in the lungs. Atelectasis or infiltration in the left lung base. Bilateral pleural effusions. No pneumothorax. Calcified and tortuous aorta. Degenerative changes in the spine and shoulders. IMPRESSION: Cardiac enlargement with bilateral pleural effusions. Basilar atelectasis. Electronically Signed    By: Burman Nieves M.D.   On: 03/28/2015 20:08        Scheduled Meds: . amiodarone  400 mg Oral BID  . aspirin EC  325 mg Oral Daily  . Chlorhexidine Gluconate Cloth  6 each Topical Q0600  . digoxin  0.125 mg Oral Daily  . enoxaparin (LOVENOX) injection  30 mg Subcutaneous Q24H  . furosemide  40 mg Oral Daily  . lamoTRIgine  50 mg Oral Daily  . levothyroxine  50 mcg Oral QAC breakfast  . metoprolol tartrate  12.5 mg Oral BID  . mupirocin ointment  1 application Nasal BID  . risperiDONE  0.5 mg Oral Daily  . sertraline  50 mg Oral Daily  . sodium chloride  3 mL Intravenous Q12H  . sodium chloride flush  3 mL Intravenous Q12H   Continuous Infusions:    Active Problems:   Chronic kidney disease, stage III (moderate)   Hypertension   Anemia in chronic kidney disease   Dementia with behavioral disturbance   Acute on chronic systolic heart failure, NYHA class 2 (HCC)   Atrial fibrillation with RVR (HCC)   CHF exacerbation (HCC)    Time spent: 30 minutes.    Marcellus Scott, MD, FACP, FHM. [facility] Pager [phone]  If 7PM-7AM, please contact night-coverage [email or web address] Password [email or web address] 04/13/2015, 10:54 AM    LOS: 2 days

## 2015-04-13 NOTE — Progress Notes (Signed)
Patient Name: Isabel Chaney Date of Encounter: 04/13/2015  Active Problems:   Chronic kidney disease, stage III (moderate)   Hypertension   Anemia in chronic kidney disease   Dementia with behavioral disturbance   Acute on chronic systolic heart failure, NYHA class 2 (HCC)   Atrial fibrillation with RVR (HCC)   CHF exacerbation (HCC)   Length of Stay: 2  SUBJECTIVE  The patient looks comfortable in bed but appears somewhat confused. She has been hypotensive and diltiazem drip had to discontinued  CURRENT MEDS . aspirin EC  325 mg Oral Daily  . Chlorhexidine Gluconate Cloth  6 each Topical Q0600  . enoxaparin (LOVENOX) injection  30 mg Subcutaneous Q24H  . furosemide  40 mg Intravenous BID  . lamoTRIgine  50 mg Oral Daily  . levothyroxine  50 mcg Oral QAC breakfast  . metoprolol tartrate  12.5 mg Oral BID  . mupirocin ointment  1 application Nasal BID  . risperiDONE  0.5 mg Oral Daily  . sertraline  50 mg Oral Daily  . sodium chloride  3 mL Intravenous Q12H  . sodium chloride flush  3 mL Intravenous Q12H   . diltiazem (CARDIZEM) infusion Stopped (04/12/15 1830)   OBJECTIVE  Filed Vitals:   04/13/15 0700 04/13/15 0742 04/13/15 0800 04/13/15 0810  BP: 96/64  Pulse: 121 102 138 106  Temp:  97.5 F (36.4 C)    TempSrc:  Oral    Resp: Weight:      SpO2: 99% 98% 98% 98%    Intake/Output Summary (Last 24 hours) at 04/13/15 0834 Last data filed at 04/13/15 0600  Gross per 24 hour  Intake    820 ml  Output    545 ml  Net    275 ml   Filed Weights   04/12/15 0100 04/13/15 0600  Weight: 157 lb 13.6 oz (71.6 kg) 160 lb 11.5 oz (72.9 kg)   PHYSICAL EXAM  General: Pleasant, NAD. Neuro: confused HEENT:  Normal  Neck: Supple without bruits or JVD. Lungs:  Resp regular and unlabored, some crackles at bases. Heart: RRR no s3, s4, or murmurs. Abdomen: Soft, non-tender, non-distended, BS + x 4.  Extremities: No clubbing, cyanosis or  edema. DP/PT/Radials 2+ and equal bilaterally.  Accessory Clinical Findings  CBC  Recent Labs  04/06/2015 1948 04/13/15 0318  WBC 5.5 5.4  HGB 10.6* 10.7*  HCT 34.1* 34.9*  MCV 96.3 97.8  PLT 153 155   Basic Metabolic Panel  Recent Labs  04/12/15 0902 04/13/15 0318  NA 141 141  K 3.2* 4.8  CL 108 108  CO2 21* 22  GLUCOSE 123* 98  BUN 51* 56*  CREATININE 1.25* 1.38*  CALCIUM 8.8* 8.8*    Recent Labs  04/12/15 0902 04/12/15 1322  TROPONINI 0.06* 0.05*    Recent Labs  04/12/15 0902  TSH 3.573    Radiology/Studies  Dg Chest 2 View  03/27/2015  CLINICAL DATA:  Pitting edema throughout both legs and into the abdomen, arms, and face. History of stage 3 kidney disease. EXAM: CHEST  2 VIEW COMPARISON:  09/30/2012 FINDINGS: Cardiac enlargement. No significant vascular congestion or edema. No focal consolidation in the lungs. Atelectasis or infiltration in the left lung base. Bilateral pleural effusions. No pneumothorax. Calcified and tortuous aorta. Degenerative changes in the spine and shoulders. IMPRESSION: Cardiac enlargement with bilateral pleural effusions. Basilar atelectasis. Electronically Signed   By: Burman Nieves M.D.   On:  05/09/2015 20:08    TELE: atrial fibrillation with ventricular rate 87-112 BPM  TTE: 04/12/15 - Left ventricle: The cavity size was mildly dilated. Wall thickness was increased in a pattern of mild LVH. Systolic function was severely reduced. The estimated ejection fraction was in the range of 20% to 25%. - Aortic valve: There was moderate stenosis. There was mild to moderate regurgitation. Valve area (VTI): 0.24 cm^2. Valve area (Vmax): 0.27 cm^2. Valve area (Vmean): 0.25 cm^2. - Mitral valve: Moderately calcified annulus. Mildly thickened leaflets . There was moderate to severe regurgitation. - Left atrium: The atrium was severely dilated. - Right ventricle: The cavity size was mildly dilated. Systolic function was  mildly reduced. - Right atrium: The atrium was moderately to severely dilated.    ASSESSMENT AND PLAN  1. New diagnosis of a-fib with RVR,  - CHADS-VASc 6, however the risk of bleeding outweighs the risk of stroke, I would suggest use of ASA 325 mg po daily only - unable to use AV nodal blocking agents sec to hypotension - we will start amiodarone 400 mg po BID x 3 days, followed by amiodarone 400 mg po daily, followed by amiodarone 200 mg po daily - we will also start a low dose digoxin 0.125 mg po daily  - she is being evaluated by a palliative care considering multiple comorbidities, very poor prognosis   2. Acute on systolic CHF with severe LV dysfunction, LVEF 20-25% that is new from echo in July 2016, considering her age, dementia, comorbidities, we will not advise to perform further ischemic workup  - Crea is worsening, switch to 40 mg po lasix daily.  - unable to use ACEI/ARB sec to hypotension and CKD  3. Hypokalemia - replaced  4. Demand ischemia - sec to a-fib with RVR, no ischemic work up indicated    Signed, Lars Masson MD, [facility] 04/13/2015

## 2015-04-13 NOTE — Progress Notes (Signed)
Spoke with Theodore Demark, NP about pts afib rvr in the 140's. Orders given to start amio.

## 2015-04-14 DIAGNOSIS — I5021 Acute systolic (congestive) heart failure: Secondary | ICD-10-CM

## 2015-04-14 DIAGNOSIS — N179 Acute kidney failure, unspecified: Secondary | ICD-10-CM

## 2015-04-14 LAB — BASIC METABOLIC PANEL
Anion gap: 13 (ref 5–15)
BUN: 65 mg/dL — AB (ref 6–20)
CO2: 20 mmol/L — ABNORMAL LOW (ref 22–32)
CREATININE: 1.75 mg/dL — AB (ref 0.44–1.00)
Calcium: 9.3 mg/dL (ref 8.9–10.3)
Chloride: 105 mmol/L (ref 101–111)
GFR, EST AFRICAN AMERICAN: 28 mL/min — AB (ref >60)
GFR, EST NON AFRICAN AMERICAN: 24 mL/min — AB (ref >60)
Glucose, Bld: 171 mg/dL — ABNORMAL HIGH (ref 65–99)
Potassium: 5.3 mmol/L — ABNORMAL HIGH (ref 3.5–5.1)
SODIUM: 138 mmol/L (ref 135–145)

## 2015-04-14 MED ORDER — METOPROLOL TARTRATE 1 MG/ML IV SOLN
5.0000 mg | Freq: Once | INTRAVENOUS | Status: AC
  Administered 2015-04-14: 5 mg via INTRAVENOUS
  Filled 2015-04-14: qty 5

## 2015-04-14 MED ORDER — AMIODARONE HCL 150 MG/3ML IV SOLN: 150.0000 mg | Freq: Four times a day (QID) | INTRAVENOUS | Status: DC | PRN

## 2015-04-14 MED ORDER — METOPROLOL TARTRATE 1 MG/ML IV SOLN
2.5000 mg | INTRAVENOUS | Status: DC | PRN
  Administered 2015-04-14: 2.5 mg via INTRAVENOUS
  Filled 2015-04-14: qty 5

## 2015-04-14 MED ORDER — METOPROLOL TARTRATE 1 MG/ML IV SOLN
INTRAVENOUS | Status: AC
  Filled 2015-04-14: qty 5

## 2015-04-14 MED ORDER — SODIUM POLYSTYRENE SULFONATE 15 GM/60ML PO SUSP: 15.0000 g | Freq: Once | ORAL | Status: DC

## 2015-04-14 MED ORDER — AMIODARONE LOAD VIA INFUSION
150.0000 mg | Freq: Four times a day (QID) | INTRAVENOUS | Status: DC | PRN
  Administered 2015-04-14: 150 mg via INTRAVENOUS
  Filled 2015-04-14: qty 83.34

## 2015-04-14 MED ORDER — METOPROLOL TARTRATE 1 MG/ML IV SOLN
5.0000 mg | Freq: Once | INTRAVENOUS | Status: AC
  Administered 2015-04-14: 5 mg via INTRAVENOUS

## 2015-04-16 LAB — CULTURE, BLOOD (ROUTINE X 2)
Culture: NO GROWTH
Culture: NO GROWTH

## 2015-04-19 NOTE — Progress Notes (Addendum)
Patient Name: Isabel Chaney Date of Encounter: 04/19/15  Active Problems:   Chronic kidney disease, stage III (moderate)   Hypertension   Anemia in chronic kidney disease   Dementia with behavioral disturbance   Acute on chronic systolic heart failure, NYHA class 2 (HCC)   Atrial fibrillation with RVR (HCC)   CHF exacerbation (HCC)   Atrial fibrillation with rapid ventricular response (HCC)   Failure to thrive in adult   Primary Cardiologist: Dr Delton See  Patient Profile: 82 yo female w/ hx hypothyroid, anemia, anxiety, depression, mod AS, S-CHF (EF 20-25% echo this admit), CKD III. Admitted 01/24 w/ SOB. Cards seeing for atrial fib, RVR.  SUBJECTIVE: Mouth is dry, wants to go out.  OBJECTIVE Filed Vitals:   19-Apr-2015 0300 04/19/15 0400 2015/04/19 0500 April 19, 2015 0600  BP: 114/74 123/110 107/91 96/62  Pulse: 104 131 53 132  Temp: 98.5 F (36.9 C)     TempSrc: Axillary     Resp: 35 Weight: 156 lb 8.4 oz (71 kg)     SpO2: 97% 97% 94% 97%    Intake/Output Summary (Last 24 hours) at 04/19/2015 0647 Last data filed at Apr 19, 2015 0500  Gross per 24 hour  Intake 721.01 ml  Output    180 ml  Net 541.01 ml   Filed Weights   04/12/15 0100 04/13/15 0600 04/19/2015 0300  Weight: 157 lb 13.6 oz (71.6 kg) 160 lb 11.5 oz (72.9 kg) 156 lb 8.4 oz (71 kg)    PHYSICAL EXAM General: Well developed, well nourished, Head: Normocephalic, atraumatic.  Neck: Supple without bruits, JVD difficult to assess 2nd body habitus. Lungs:  Resp regular and unlabored, bronchial breath sounds, decreased in bases Heart: Rapid and irregular, S1, S2, no S3, S4, 2/6 murmur; no rub. Abdomen: Soft, non-tender, non-distended, BS + x 4.  Extremities: No clubbing, cyanosis, edema.  Neuro: Alert and oriented X 1. Moves all extremities spontaneously. Psych: In mild respiratory distress  LABS: CBC: Recent Labs  04/04/2015 1948 04/13/15 0318  WBC 5.5 5.4  HGB 10.6* 10.7*  HCT 34.1* 34.9*  MCV 96.3  97.8  PLT 153 155   Basic Metabolic Panel: Recent Labs  04/13/15 0318 19-Apr-2015 0310  Isabel 141 138  K 4.8 5.3*  CL 108 105  CO2 22 20*  GLUCOSE 98 171*  BUN 56* 65*  CREATININE 1.38* 1.75*  CALCIUM 8.8* 9.3   Liver Function Tests:No results for input(s): AST, ALT, ALKPHOS, BILITOT, PROT, ALBUMIN in the last 72 hours. Cardiac Enzymes: Recent Labs  04/12/15 0902 04/12/15 1322  TROPONINI 0.06* 0.05*    BNP:  B NATRIURETIC PEPTIDE  Date/Time Value Ref Range Status  04/06/2015 07:48 PM 768.1* 0.0 - 100.0 pg/mL Final   Thyroid Function Tests: Recent Labs  04/12/15 0902  TSH 3.573   Anemia Panel: Recent Labs  04/12/15 0902  VITAMINB12 1050*  FOLATE 23.3  FERRITIN 259  TIBC 168*  IRON 29  RETICCTPCT 2.2    TELE:  SR>>Atrial fib, RVR      Current Medications:  . aspirin EC  325 mg Oral Daily  . Chlorhexidine Gluconate Cloth  6 each Topical Q0600  . digoxin  0.125 mg Oral Daily  . enoxaparin (LOVENOX) injection  30 mg Subcutaneous Q24H  . feeding supplement (PRO-STAT SUGAR FREE 64)  30 mL Oral BID  . furosemide  40 mg Oral Daily  . lamoTRIgine  50 mg Oral Daily  . levothyroxine  50 mcg Oral QAC breakfast  .  metoprolol      . metoprolol tartrate  12.5 mg Oral BID  . multivitamin with minerals  1 tablet Oral Daily  . mupirocin ointment  1 application Nasal BID  . risperiDONE  0.5 mg Oral Daily  . sertraline  50 mg Oral Daily  . sodium chloride  3 mL Intravenous Q12H  . sodium chloride flush  3 mL Intravenous Q12H   . amiodarone 30 mg/hr (Apr 15, 2015 0220)    ASSESSMENT AND PLAN: 1. New diagnosis of a-fib with RVR,  - CHADS-VASc 6, however the risk of bleeding outweighs the risk of stroke, I would suggest use of ASA 325 mg po daily only - unable to use AV nodal blocking agents sec to hypotension - Amiodarone 400 mg po BID x 3 days, followed by amiodarone 400 mg po daily, followed by amiodarone 200 mg po daily started 01/26, but pt went back into afib and  changed back to IV. Continue this and use PRN amio boluses and low dose IV metoprolol - low dose digoxin 0.125 mg po daily started 01/26 - TSH OK, ck LFTs  - she is being evaluated by a palliative care considering multiple comorbidities, very poor prognosis. She is a DNR but not comfort care  2. Acute on systolic CHF with severe LV dysfunction, LVEF 20-25% that is new from echo in July 2016, considering her age, dementia, comorbidities, we will not advise to perform further ischemic workup  - Crea is worsening, switch to 40 mg po lasix daily.  - unable to use ACEI/ARB sec to hypotension and CKD  3. Hypokalemia - replaced  4. Demand ischemia - sec to a-fib with RVR, no ischemic work up indicated  5. Hyperkalemia - no supplement since 60 meq given 01/25. Per IM  Active Problems:   Chronic kidney disease, stage III (moderate)   Hypertension   Anemia in chronic kidney disease   Dementia with behavioral disturbance   Acute on chronic systolic heart failure, NYHA class 2 (HCC)   Atrial fibrillation with RVR (HCC)   CHF exacerbation (HCC)   Atrial fibrillation with rapid ventricular response (HCC)   Failure to thrive in adult  Signed, Leanna Battles 6:47 AM 2015/04/15  The patient was seen, examined and discussed with Theodore Demark, PA-C and I agree with the above.   A-fib with RVR, started on iv amiodarone yesterday, continue until rate controlled, d/c Digoxin as Crea is worsening, we will follow. She is being evaluated by palliative care, prognosis poor, age, bedridden, multiorgan failure, inlucing worsening kidney failure and LVEF 20%.   Lars Masson 2015-04-15  Addendum: we were called that the patient expired at 8: 56 am after what seems like an aspiration followed by respiratory distress and shortly after an asystole. The patient was DNR.  Lars Masson 15-Apr-2015

## 2015-04-19 NOTE — Progress Notes (Signed)
Heart rate sustaining in the 120-130s up to 140s on IV amio, pt denies any chest pain but indicates she is anxious and feels short of breath. O2 sat 96% on 2 L nasal cannula; BP 114/74 (83). MD made aware and ordered 5 mg IV metoprolol to be given now and again in 2 hrs if rate >140.  Will continue to monitor pt.  Stanford Breed, RN 04/03/2015 2:12 AM

## 2015-04-19 NOTE — Progress Notes (Signed)
This AM patient was asking for water. Used the Thicken up to thicken her water to nectar thick. Pt drank the water along with her pill in a little applesauce per her diet instructions. Pt had no signs of coughing or aspiration. 10 mins later pt stated that she couldn't cough up something in her throat. Used the yaunkaur to suction pts mouth and did suction some brown fluid. Pt began to gurgle and had difficulty breathing. Paged MD and he came to bedside. Pt became agonal and HR dropped to the 20s. 3 RNs at bedside with the pt. MD spoke with daughter on the phone and informed her of situation.    At [phone] pt asystole with no signs of breathing. 2 RNs auscultated heart and lung sounds for 2 full mins no heart and lung sounds auscultated. MD still at bedside and made aware. He informed daughter of time of death. Daughter stated that she would not be coming up to the hospital. Told daughter to call with any questions or concerns.

## 2015-04-19 NOTE — Discharge Summary (Signed)
Death Summary  Isabel Chaney WRU:[id number] DOB: 12/29/1919 DOA: 04/15/15  PCP: Oneal Grout, MD PCP/Office notified: called and informed PCP.  Admit date: 04/15/2015 Date of Death: 2015-04-18  Final Diagnoses:  Active Problems:   Chronic kidney disease, stage III (moderate)   Hypertension   Anemia in chronic kidney disease   Dementia with behavioral disturbance   Acute on chronic systolic heart failure, NYHA class 2 (HCC)   Atrial fibrillation with RVR (HCC)   CHF exacerbation (HCC)   Atrial fibrillation with rapid ventricular response (HCC)   Failure to thrive in adult   History of present illness:  82 year old female patient, nursing home resident, nonambulatory at baseline, PMH of hypothyroid, anemia, anxiety, depression, chronic diastolic CHF, stage III chronic kidney disease, HTN, presented to ED from NH with complaints of lower extremity edema, worsening dyspnea and orthopnea. She was seen by nephrology as outpatient and sent to the ED for evaluation. In the ED found to be in new onset A. fib with RVR up to 120s. Patient was admitted to the stepdown unit. Cardiology was consulted. She was initially placed on IV Cardizem drip which had to be discontinued due to hypotension. Subsequently echo showed new cardiomyopathy with LVEF 20-25 percent. She was then started on amiodarone load and digoxin. She continued to be in rapid A. fib this morning with ventricular rates in the 120s-140s. She was not a candidate for anticoagulation due to advanced age and multiple severe comorbidities. She was started on full dose aspirin. She was treated with IV Lasix for acute systolic CHF but her creatinine started to rise and she was transitioned to oral Lasix. Her CHF was likely precipitated by rapid A. fib and new onset cardiomyopathy. She had elevated troponin which could have been from demand ischemia related to A. fib with RVR versus NSTEMI but she was not a candidate for aggressive ischemic workup. She  had multiple wounds on her body which were addressed by [facility] nurse. She had a stage III sacral pressure injury which was present on admission and several other wounds. She had severe adult failure to thrive which was multifactorial from advanced age, multiple severe comorbidities, bedbound status for the last 3 years with multiple bedsores and current acute illness leading to hospitalization. Her daughter was updated regarding her ongoing care and palliative care was consulted for goals of care. Patient was seen early this morning when she appeared confused and complained of abdominal pain. She did not seem in any respiratory distress and her heart rate fluctuated between 120-140 in A. fib. An hour or 2 after being seen, was paged by RN who stated that 10 minutes after patient drank some nectar thickened water along with her medications, started coughing up, was suctioned, then rapidly became agonal with bradycardia in the 20s. I went down to see the patient who had obviously rapidly declined. She was having agonal breathing and heart rate was dropping from 20 down. Amiodarone was held. Cardiology service was updated. She was confirmed DO NOT RESUSCITATE. Called patient's daughter Ms. Sherol Dade to update care at which time patient demised. Updated Ms. Henreitta Cea re her mother's death and offered condolences. Patient was pronounced dead at 8:56 AM.    Time: 30 minutes.  Signed:  Anthonee Gelin  [facility] April 18, 2015, 11:55 AM

## 2015-04-19 DEATH — deceased

## 2016-12-29 IMAGING — RF DG ESOPHAGUS
14 of 24 series · 14 of 24 positions shown · non-contrast
Comparison: None.

CLINICAL DATA: Dysphagia.

EXAM:
ESOPHOGRAM/BARIUM SWALLOW
TECHNIQUE: Single contrast examination was performed using  thin barium.
FLUOROSCOPY TIME:  Radiation Exposure Index (as provided by the
fluoroscopic device):
If the device does not provide the exposure index:
Fluoroscopy Time:  1 minutes and 52 seconds
Number of Acquired Images:  None

[Series 1: run · 1 of 1 slices shown (1 of 14)]
[im 1/1]
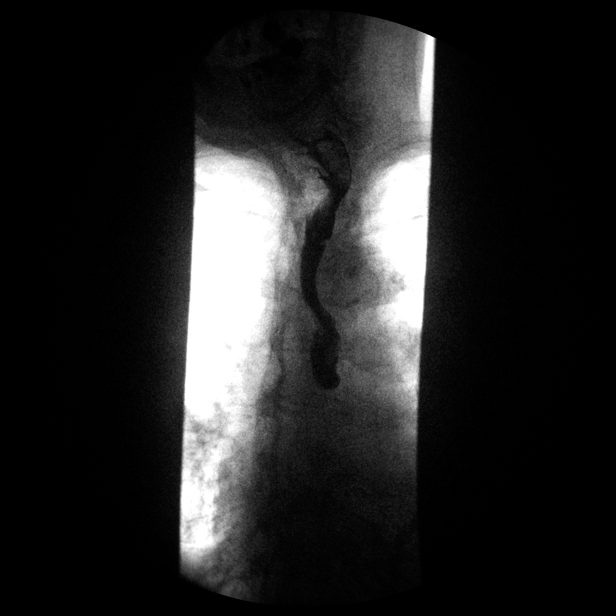

[Series 3: run · 1 of 1 slices shown (2 of 14)]
[im 1/1]
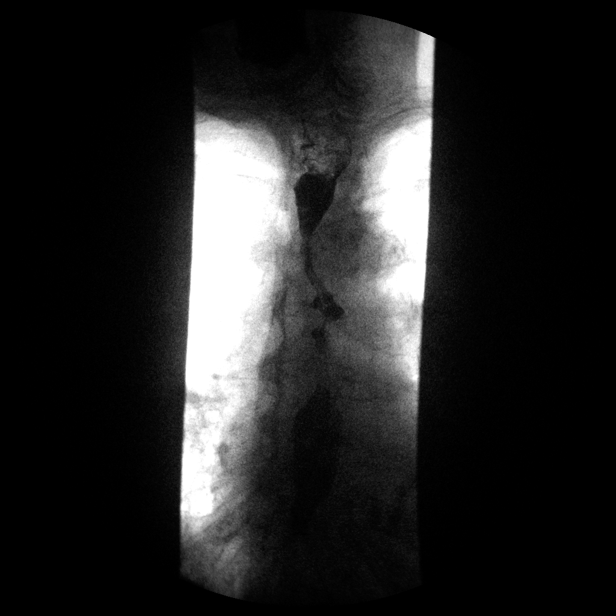

[Series 5: run · 1 of 1 slices shown (3 of 14)]
[im 1/1]
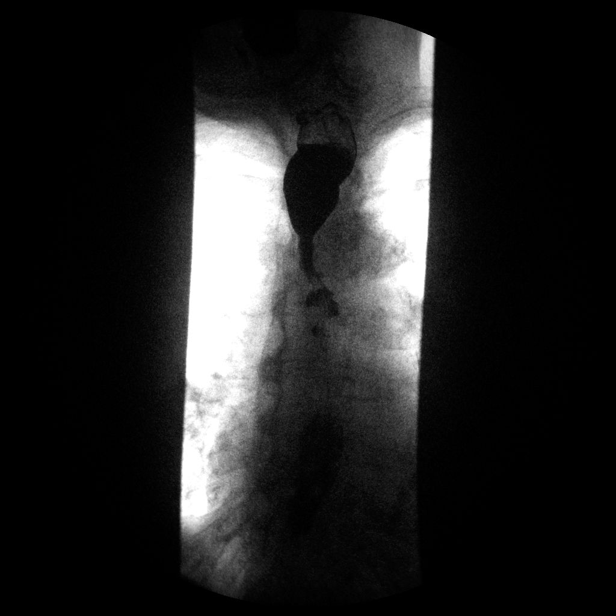

[Series 7: run · 1 of 1 slices shown (4 of 14)]
[im 1/1]
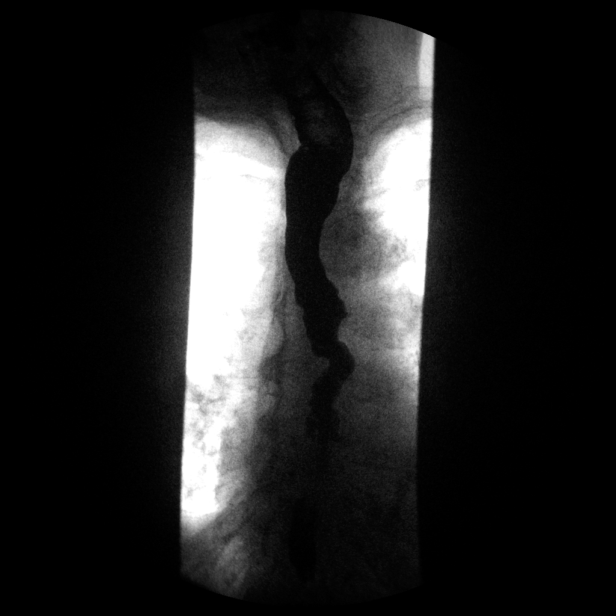

[Series 8: run · 1 of 1 slices shown (5 of 14)]
[im 1/1]
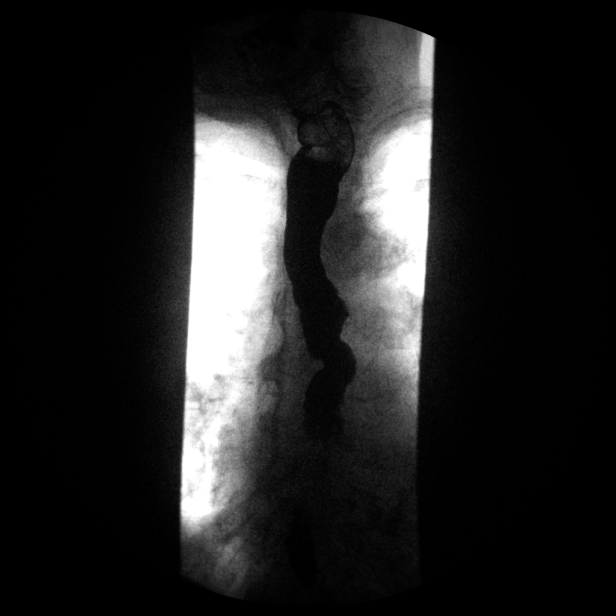

[Series 10: run · 1 of 1 slices shown (6 of 14)]
[im 1/1]
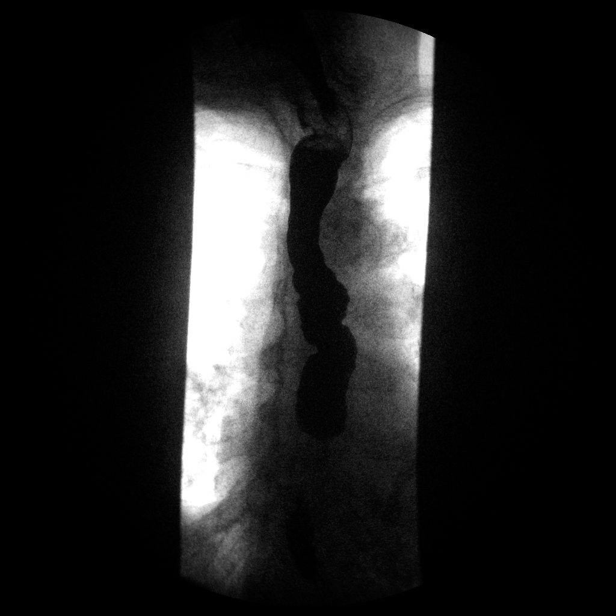

[Series 12: run · 1 of 1 slices shown (7 of 14)]
[im 1/1]
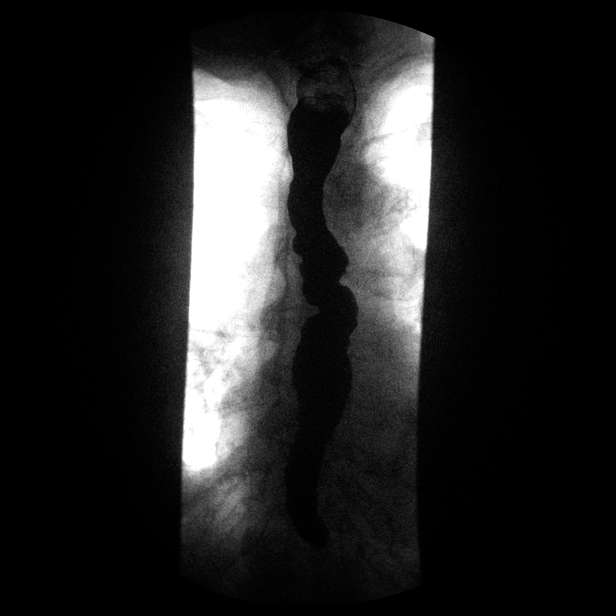

[Series 13: run · 1 of 1 slices shown (8 of 14)]
[im 1/1]
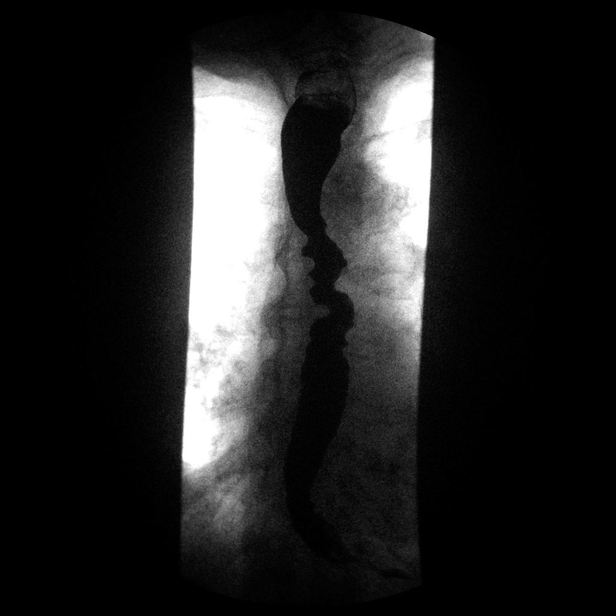

[Series 15: run · 1 of 1 slices shown (9 of 14)]
[im 1/1]
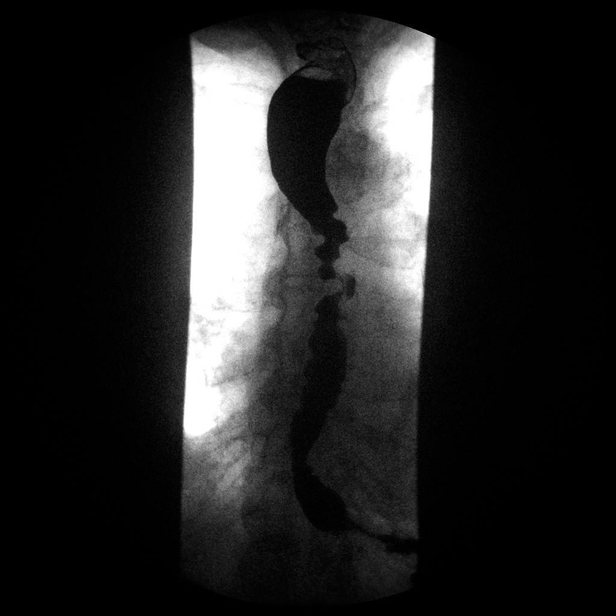

[Series 17: run · 1 of 1 slices shown (10 of 14)]
[im 1/1]
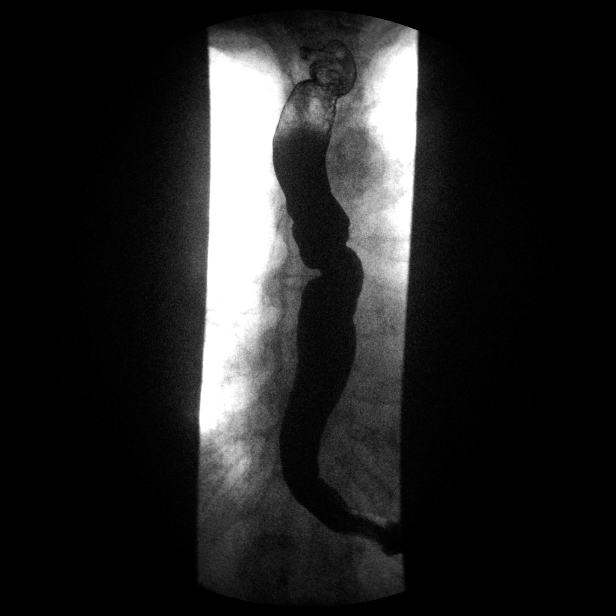

[Series 19: run · 1 of 1 slices shown (11 of 14)]
[im 1/1]
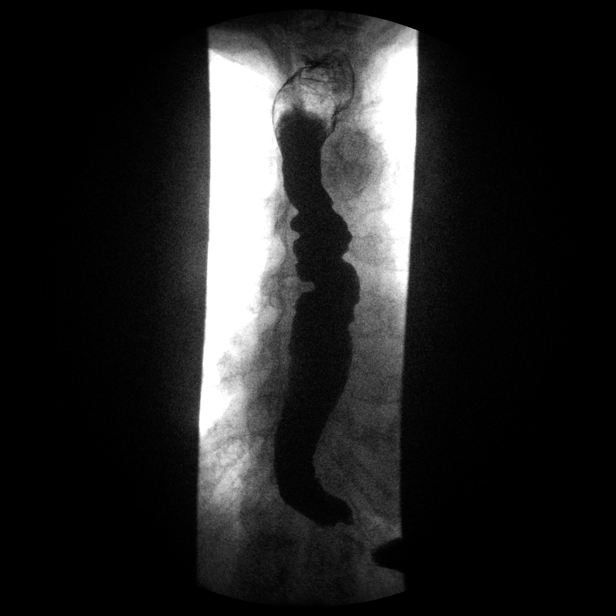

[Series 20: run · 1 of 1 slices shown (12 of 14)]
[im 1/1]
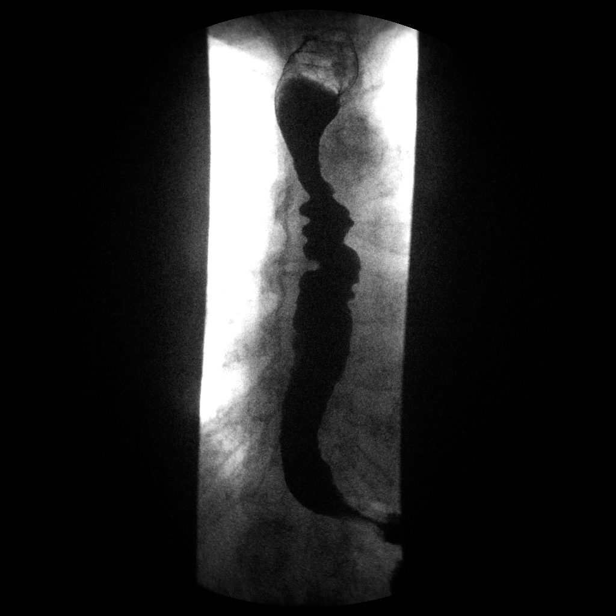

[Series 22: run · 1 of 1 slices shown (13 of 14)]
[im 1/1]
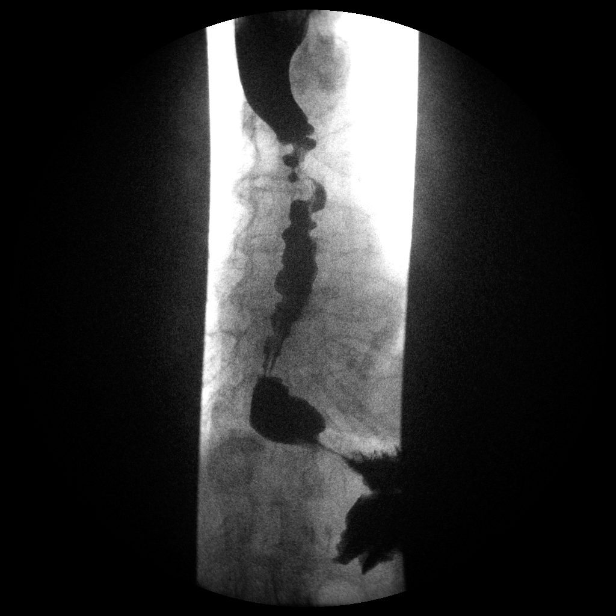

[Series 24: run · 1 of 1 slices shown (14 of 14)]
[im 1/1]
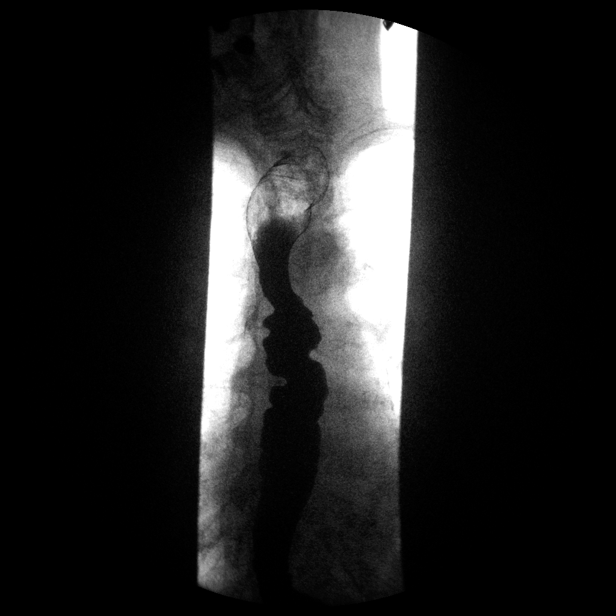

[14 of 24 positions shown; findings below may reference images not displayed]

FINDINGS: Limited exam was performed with patient in a supine, minimally right
posterior oblique position. Difficulty secondary to patient
immobility and inability to follow directions. Focused
single-contrast exam demonstrates probable esophageal dysmotility,
suboptimally evaluated. Incomplete primary peristaltic wave with
suggestion of tertiary contractions in the mid and lower thoracic
esophagus. No dominant stricture identified.
IMPRESSION: 1. Focused, moderately degraded exam performed with the patient
supine, minimally oblique, as detailed above.
2. No dominant stricture identified within the esophagus, given
above limitations.
3. Probable esophageal dysmotility due to presbyesophagus.

## 2017-01-16 IMAGING — RF DG SWALLOWING FUNCTION
1 series · 1 of 1 positions shown · non-contrast
Comparison: Esophagram dated 07/11/2014

CLINICAL DATA: Recent pneumonia.  Presbyesophagus.

EXAM:
MODIFIED BARIUM SWALLOW
TECHNIQUE: Different consistencies of barium were administered orally to the
patient by the Speech Pathologist. Imaging of the pharynx was
performed in the lateral projection.
FLUOROSCOPY TIME:  Fluoroscopy Time:  2 minutes 18 seconds
Number of Acquired Images:  1

[Series 1: run · 1 of 1 slices shown]
[im 1/1]
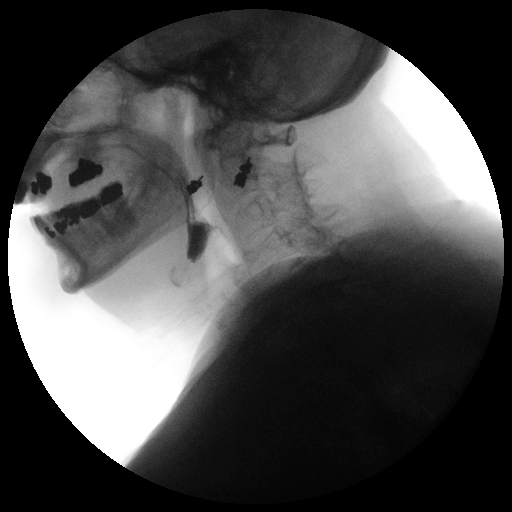

[1 of 1 positions shown; findings below may reference images not displayed]

FINDINGS: Thin liquid- recurrent asymptomatic flash laryngeal penetration and
aspiration.

Nectar thick liquid- within normal limits

Andishe?[place] within normal limits

Barium tablet -  within normal limits
IMPRESSION: Recurrent asymptomatic flash laryngeal penetration and aspiration
with thin liquid.

Please refer to the Speech Pathologists report for complete details
and recommendations.
